# Patient Record
Sex: Male | Born: 1960 | Race: Asian | Hispanic: Refuse to answer | Marital: Married | Smoking: Former smoker
Health system: Southern US, Community
[De-identification: ages and names within clinical notes are randomized; demographics above are authoritative.]

## PROBLEM LIST (undated history)

## (undated) DIAGNOSIS — J45909 Unspecified asthma, uncomplicated: Secondary | ICD-10-CM

## (undated) DIAGNOSIS — A159 Respiratory tuberculosis unspecified: Secondary | ICD-10-CM

## (undated) HISTORY — PX: NO PAST SURGERIES: SHX2092

---

## 2017-02-09 ENCOUNTER — Emergency Department: Payer: Self-pay

## 2017-02-09 ENCOUNTER — Observation Stay
Admission: EM | Admit: 2017-02-09 | Discharge: 2017-02-10 | Disposition: A | Discharge status: Home / Self Care | Payer: Self-pay | Attending: Internal Medicine | Admitting: Internal Medicine

## 2017-02-09 DIAGNOSIS — J9601 Acute respiratory failure with hypoxia: Secondary | ICD-10-CM | POA: Insufficient documentation

## 2017-02-09 DIAGNOSIS — M25512 Pain in left shoulder: Secondary | ICD-10-CM | POA: Insufficient documentation

## 2017-02-09 DIAGNOSIS — Z8611 Personal history of tuberculosis: Secondary | ICD-10-CM | POA: Insufficient documentation

## 2017-02-09 DIAGNOSIS — M25519 Pain in unspecified shoulder: Secondary | ICD-10-CM

## 2017-02-09 DIAGNOSIS — J45901 Unspecified asthma with (acute) exacerbation: Secondary | ICD-10-CM | POA: Insufficient documentation

## 2017-02-09 DIAGNOSIS — J101 Influenza due to other identified influenza virus with other respiratory manifestations: Principal | ICD-10-CM | POA: Insufficient documentation

## 2017-02-09 HISTORY — DX: Unspecified asthma, uncomplicated: J45.909

## 2017-02-09 HISTORY — DX: Respiratory tuberculosis unspecified: A15.9

## 2017-02-09 LAB — COMPREHENSIVE METABOLIC PANEL
ALT: 19 U/L (ref 17–63)
AST: 41 U/L (ref 15–41)
Albumin: 3.5 g/dL (ref 3.5–5.0)
Alkaline Phosphatase: 49 U/L (ref 38–126)
Anion gap: 8 (ref 5–15)
BILIRUBIN TOTAL: 0.4 mg/dL (ref 0.3–1.2)
BUN: 18 mg/dL (ref 6–20)
CHLORIDE: 104 mmol/L (ref 101–111)
CO2: 25 mmol/L (ref 22–32)
Calcium: 7.9 mg/dL — ABNORMAL LOW (ref 8.9–10.3)
Creatinine, Ser: 0.99 mg/dL (ref 0.61–1.24)
Glucose, Bld: 144 mg/dL — ABNORMAL HIGH (ref 65–99)
POTASSIUM: 3.7 mmol/L (ref 3.5–5.1)
Sodium: 137 mmol/L (ref 135–145)
TOTAL PROTEIN: 7 g/dL (ref 6.5–8.1)

## 2017-02-09 LAB — CBC WITH DIFFERENTIAL/PLATELET
BASOS ABS: 0 10*3/uL (ref 0–0.1)
Basophils Relative: 0 %
Eosinophils Absolute: 0 10*3/uL (ref 0–0.7)
Eosinophils Relative: 0 %
HCT: 37.4 % — ABNORMAL LOW (ref 40.0–52.0)
Hemoglobin: 12.5 g/dL — ABNORMAL LOW (ref 13.0–18.0)
LYMPHS ABS: 0.8 10*3/uL — AB (ref 1.0–3.6)
LYMPHS PCT: 12 %
MCH: 30.7 pg (ref 26.0–34.0)
MCHC: 33.5 g/dL (ref 32.0–36.0)
MCV: 91.5 fL (ref 80.0–100.0)
Monocytes Absolute: 0.5 10*3/uL (ref 0.2–1.0)
Monocytes Relative: 8 %
NEUTROS ABS: 5.4 10*3/uL (ref 1.4–6.5)
Neutrophils Relative %: 80 %
PLATELETS: 126 10*3/uL — AB (ref 150–440)
RBC: 4.08 MIL/uL — AB (ref 4.40–5.90)
RDW: 13.4 % (ref 11.5–14.5)
WBC: 6.7 10*3/uL (ref 3.8–10.6)

## 2017-02-09 LAB — INFLUENZA PANEL BY PCR (TYPE A & B)
INFLBPCR: NEGATIVE
Influenza A By PCR: POSITIVE — AB

## 2017-02-09 LAB — TROPONIN I: Troponin I: <0.03 ng/mL (ref <0.03)

## 2017-02-09 MED ORDER — IPRATROPIUM-ALBUTEROL 0.5-2.5 (3) MG/3ML IN SOLN
3.0000 mL | Freq: Once | RESPIRATORY_TRACT | Status: AC
  Administered 2017-02-09: 3 mL via RESPIRATORY_TRACT
  Filled 2017-02-09: qty 3

## 2017-02-09 MED ORDER — OSELTAMIVIR PHOSPHATE 75 MG PO CAPS
75.0000 mg | ORAL_CAPSULE | Freq: Once | ORAL | Status: AC
  Administered 2017-02-09: 75 mg via ORAL
  Filled 2017-02-09: qty 1

## 2017-02-09 MED ORDER — ONDANSETRON HCL 4 MG/2ML IJ SOLN
4.0000 mg | Freq: Once | INTRAMUSCULAR | Status: AC
  Administered 2017-02-09: 4 mg via INTRAVENOUS
  Filled 2017-02-09: qty 2

## 2017-02-09 MED ORDER — SODIUM CHLORIDE 0.9 % IV BOLUS (SEPSIS)
1000.0000 mL | Freq: Once | INTRAVENOUS | Status: AC
  Administered 2017-02-10: 1000 mL via INTRAVENOUS

## 2017-02-09 MED ORDER — METHYLPREDNISOLONE SODIUM SUCC 125 MG IJ SOLR
125.0000 mg | Freq: Once | INTRAMUSCULAR | Status: AC
  Administered 2017-02-09: 125 mg via INTRAVENOUS
  Filled 2017-02-09: qty 2

## 2017-02-09 MED ORDER — IOPAMIDOL (ISOVUE-300) INJECTION 61%
75.0000 mL | Freq: Once | INTRAVENOUS | Status: AC | PRN
  Administered 2017-02-09: 75 mL via INTRAVENOUS

## 2017-02-09 MED ORDER — SODIUM CHLORIDE 0.9 % IV BOLUS (SEPSIS)
1000.0000 mL | Freq: Once | INTRAVENOUS | Status: AC
  Administered 2017-02-09: 1000 mL via INTRAVENOUS

## 2017-02-09 NOTE — ED Provider Notes (Addendum)
[facility] Emergency Department Provider Note  ____________________________________________  Time seen: Approximately 10:57 PM  I have reviewed the triage vital signs and the nursing notes.   HISTORY  Chief Complaint Emesis and Weakness   HPI Bradley Morrow is a 57 y.o. male with a history of asthma and tuberculosis for which patient was treated several years ago who presents for flulike symptoms since yesterday. Patient reports 2 episodes of nonbloody nonbilious emesis, cough productive of white phlegm, fever, chills, and shortness of breath. He has been wheezing. He complains of severe constant shortness of breath with it is worse with minimal exertion since yesterday. No diarrhea. Patient endorse dizziness.  Past Medical History:  Diagnosis Date  . Asthma   . Tuberculosis     There are no active problems to display for this patient.   History reviewed. No pertinent surgical history.  Prior to Admission medications   Not on File    Allergies Patient has no known allergies.  No family history on file.  Social History Social History   Tobacco Use  . Smoking status: Never Smoker  . Smokeless tobacco: Never Used  Substance Use Topics  . Alcohol use: No    Frequency: Never  . Drug use: Not on file    Review of Systems  Constitutional: + fever, chills, dizziness Eyes: Negative for visual changes. ENT: Negative for sore throat. Neck: No neck pain  Cardiovascular: Negative for chest pain. Respiratory: + shortness of breath, cough and wheezing Gastrointestinal: Negative for abdominal pain, diarrhea. + Nausea and vomiting Genitourinary: Negative for dysuria. Musculoskeletal: Negative for back pain. Skin: Negative for rash. Neurological: Negative for headaches, weakness or numbness. Psych: No SI or HI  ____________________________________________   PHYSICAL EXAM:  VITAL SIGNS: ED Triage Vitals  Enc Vitals Group     BP 02/09/17 2025  118/67     Pulse Rate 02/09/17 2025 (!) 53     Resp 02/09/17 2025 16     Temp 02/09/17 2025 98.6 F (37 C)     Temp Source 02/09/17 2025 Oral     SpO2 02/09/17 2022 94 %     Weight 02/09/17 2026 122 lb (55.3 kg)     Height --      Head Circumference --      Peak Flow --      Pain Score 02/09/17 2026 4     Pain Loc --      Pain Edu? --      Excl. in GC? --     Constitutional: Alert and oriented. Well appearing and in no apparent distress. HEENT:      Head: Normocephalic and atraumatic.         Eyes: Conjunctivae are normal. Sclera is non-icteric.       Mouth/Throat: Mucous membranes are moist.       Neck: Supple with no signs of meningismus. Cardiovascular: Regular rate and rhythm. No murmurs, gallops, or rubs. 2+ symmetrical distal pulses are present in all extremities. No JVD. Respiratory: Increased work of breathing, hypoxic on room air, diffuse expiratory wheezes and decreased air movement on the left Gastrointestinal: Soft, non tender, and non distended with positive bowel sounds. No rebound or guarding. Musculoskeletal: Nontender with normal range of motion in all extremities. No edema, cyanosis, or erythema of extremities. Neurologic: Normal speech and language. Face is symmetric. Moving all extremities. No gross focal neurologic deficits are appreciated. Skin: Skin is warm, dry and intact. No rash noted. Psychiatric: Mood and affect  are normal. Speech and behavior are normal.  ____________________________________________   LABS (all labs ordered are listed, but only abnormal results are displayed)  Labs Reviewed  COMPREHENSIVE METABOLIC PANEL - Abnormal; Notable for the following components:      Result Value   Glucose, Bld 144 (*)    Calcium 7.9 (*)    All other components within normal limits  CBC WITH DIFFERENTIAL/PLATELET - Abnormal; Notable for the following components:   RBC 4.08 (*)    Hemoglobin 12.5 (*)    HCT 37.4 (*)    Platelets 126 (*)    Lymphs Abs  0.8 (*)    All other components within normal limits  INFLUENZA PANEL BY PCR (TYPE A & B) - Abnormal; Notable for the following components:   Influenza A By PCR POSITIVE (*)    All other components within normal limits  URINALYSIS, COMPLETE (UACMP) WITH MICROSCOPIC  TROPONIN I  TROPONIN I   ____________________________________________  EKG  ED ECG REPORT I, Nita Sickle, the attending physician, personally viewed and interpreted this ECG.  Sinus bradycardia, rate of 56, normal intervals, normal axis, no ST elevations or depressions, diffuse T-wave flattening on inferior leads. No prior for comparison ____________________________________________  RADIOLOGY  Interpreted by me: CXR: Left hemithorax is completely obscured   Interpretation by Radiologist:  Dg Chest 2 View  Result Date: 02/09/2017 CLINICAL DATA:  Productive cough with white sputum. History of asthma and TB. EXAM: CHEST  2 VIEW COMPARISON:  None. FINDINGS: There is volume loss in the left hemithorax with shift of the heart mediastinum to the left. There is pleural thickening in the apex. There appear to be cystic changes in the left lung as well. No pneumothorax. The right lung is clear. IMPRESSION: 1. Volume loss in the left hemithorax with pleural thickening, cystic lung change, and diffuse mild haziness. While indeterminate, these findings could be chronic from the patient's history of previous TB. An acute on chronic process would be difficult to exclude. 2. No other abnormalities. Electronically Signed   By: Gerome Sam III M.D   On: 02/09/2017 20:44    ____________________________________________   PROCEDURES  Procedure(s) performed:None Procedures Critical Care performed: yes  CRITICAL CARE Performed by: Nita Sickle  ?  Total critical care time: 40 min  Critical care time was exclusive of separately billable procedures and treating other patients.  Critical care was necessary to treat or  prevent imminent or life-threatening deterioration.  Critical care was time spent personally by me on the following activities: development of treatment plan with patient and/or surrogate as well as nursing, discussions with consultants, evaluation of patient's response to treatment, examination of patient, obtaining history from patient or surrogate, ordering and performing treatments and interventions, ordering and review of laboratory studies, ordering and review of radiographic studies, pulse oximetry and re-evaluation of patient's condition.  ____________________________________________   INITIAL IMPRESSION / ASSESSMENT AND PLAN / ED COURSE   57 y.o. male with a history of asthma and tuberculosis for which patient was treated several years ago who presents for flulike symptoms since yesterday. Patient with an asthma exacerbation and influenza A positive. His chest x-ray is very abnormal. We'll have a prior for comparison. CT of his chest is pending to rule out superimposed pneumonia. Patient was able to be weaned off of oxygen after DuoNeb labs and feels slightly better however based on the results of his chest x-ray and the fact that his sats dropped to 89% with ambulation patient will be admitted to  the hospital.      As part of my medical decision making, I reviewed the following data within the electronic MEDICAL RECORD NUMBER History obtained from family, Nursing notes reviewed and incorporated, Labs reviewed , EKG interpreted , Radiograph reviewed , Discussed with admitting physician , Notes from prior ED visits and [place] Controlled Substance Database    Pertinent labs & imaging results that were available during my care of the patient were reviewed by me and considered in my medical decision making (see chart for details).    ____________________________________________   FINAL CLINICAL IMPRESSION(S) / ED DIAGNOSES  Final diagnoses:  Acute respiratory failure with hypoxia (HCC)    Influenza A  Exacerbation of asthma, unspecified asthma severity, unspecified whether persistent      NEW MEDICATIONS STARTED DURING THIS VISIT:  ED Discharge Orders    None       Note:  This document was prepared using Dragon voice recognition software and may include unintentional dictation errors.    Don PerkingVeronese, [place], MD 02/09/17 2313    Nita SickleVeronese, [place], MD 02/09/17 [phone]

## 2017-02-09 NOTE — ED Triage Notes (Signed)
Patient reports productive cough with white sputum X 2 days.   Patient reports hx of asthma and TB. Patient took home albuterol treatment.

## 2017-02-09 NOTE — ED Triage Notes (Signed)
Per EMS: patient c/o N/V and weakness X 2 days. Patient given 500 mL NaCL and 4 mg Zofran in route.   Patient reports 4 out of 10 headache.

## 2017-02-10 ENCOUNTER — Encounter: Payer: Self-pay | Admitting: Internal Medicine

## 2017-02-10 ENCOUNTER — Other Ambulatory Visit: Payer: Self-pay

## 2017-02-10 ENCOUNTER — Inpatient Hospital Stay: Payer: Self-pay

## 2017-02-10 DIAGNOSIS — Z8611 Personal history of tuberculosis: Secondary | ICD-10-CM | POA: Diagnosis present

## 2017-02-10 DIAGNOSIS — J101 Influenza due to other identified influenza virus with other respiratory manifestations: Secondary | ICD-10-CM | POA: Diagnosis present

## 2017-02-10 DIAGNOSIS — J45901 Unspecified asthma with (acute) exacerbation: Secondary | ICD-10-CM | POA: Diagnosis present

## 2017-02-10 LAB — CBC
HCT: 37.7 % — ABNORMAL LOW (ref 40.0–52.0)
Hemoglobin: 12.6 g/dL — ABNORMAL LOW (ref 13.0–18.0)
MCH: 30.9 pg (ref 26.0–34.0)
MCHC: 33.5 g/dL (ref 32.0–36.0)
MCV: 92.1 fL (ref 80.0–100.0)
PLATELETS: 125 10*3/uL — AB (ref 150–440)
RBC: 4.09 MIL/uL — ABNORMAL LOW (ref 4.40–5.90)
RDW: 13.4 % (ref 11.5–14.5)
WBC: 5.5 10*3/uL (ref 3.8–10.6)

## 2017-02-10 LAB — BASIC METABOLIC PANEL
ANION GAP: 8 (ref 5–15)
BUN: 14 mg/dL (ref 6–20)
CO2: 24 mmol/L (ref 22–32)
CREATININE: 0.96 mg/dL (ref 0.61–1.24)
Calcium: 7.9 mg/dL — ABNORMAL LOW (ref 8.9–10.3)
Chloride: 111 mmol/L (ref 101–111)
GFR calc Af Amer: >60 mL/min (ref >60)
GLUCOSE: 319 mg/dL — AB (ref 65–99)
Potassium: 4 mmol/L (ref 3.5–5.1)
Sodium: 143 mmol/L (ref 135–145)

## 2017-02-10 LAB — URINALYSIS, COMPLETE (UACMP) WITH MICROSCOPIC
Bacteria, UA: NONE SEEN
Bilirubin Urine: NEGATIVE
GLUCOSE, UA: 50 mg/dL — AB
Ketones, ur: 5 mg/dL — AB
Leukocytes, UA: NEGATIVE
Nitrite: NEGATIVE
PROTEIN: NEGATIVE mg/dL
SQUAMOUS EPITHELIAL / LPF: NONE SEEN
Specific Gravity, Urine: 1.013 (ref 1.005–1.030)
pH: 6 (ref 5.0–8.0)

## 2017-02-10 MED ORDER — PREDNISONE 10 MG (21) PO TBPK: ORAL_TABLET | ORAL | 0 refills | Status: DC

## 2017-02-10 MED ORDER — IPRATROPIUM-ALBUTEROL 0.5-2.5 (3) MG/3ML IN SOLN: 3.0000 mL | RESPIRATORY_TRACT | Status: DC | PRN

## 2017-02-10 MED ORDER — ENOXAPARIN SODIUM 40 MG/0.4ML ~~LOC~~ SOLN: 40.0000 mg | SUBCUTANEOUS | Status: DC

## 2017-02-10 MED ORDER — ONDANSETRON HCL 4 MG/2ML IJ SOLN: 4.0000 mg | Freq: Four times a day (QID) | INTRAMUSCULAR | Status: DC | PRN

## 2017-02-10 MED ORDER — METHYLPREDNISOLONE SODIUM SUCC 125 MG IJ SOLR: 60.0000 mg | Freq: Four times a day (QID) | INTRAMUSCULAR | Status: DC

## 2017-02-10 MED ORDER — ACETAMINOPHEN 650 MG RE SUPP: 650.0000 mg | Freq: Four times a day (QID) | RECTAL | Status: DC | PRN

## 2017-02-10 MED ORDER — ACETAMINOPHEN 325 MG PO TABS: 650.0000 mg | ORAL_TABLET | Freq: Four times a day (QID) | ORAL | Status: DC | PRN

## 2017-02-10 MED ORDER — TRIAMCINOLONE ACETONIDE 40 MG/ML IJ SUSP
40.0000 mg | Freq: Once | INTRAMUSCULAR | Status: DC
  Filled 2017-02-10: qty 1

## 2017-02-10 MED ORDER — ONDANSETRON HCL 4 MG PO TABS: 4.0000 mg | ORAL_TABLET | Freq: Four times a day (QID) | ORAL | Status: DC | PRN

## 2017-02-10 MED ORDER — OSELTAMIVIR PHOSPHATE 75 MG PO CAPS: 75.0000 mg | ORAL_CAPSULE | Freq: Two times a day (BID) | ORAL | Status: DC

## 2017-02-10 MED ORDER — GUAIFENESIN-DM 100-10 MG/5ML PO SYRP: 5.0000 mL | ORAL_SOLUTION | ORAL | Status: DC | PRN

## 2017-02-10 MED ORDER — ENSURE ENLIVE PO LIQD: 237.0000 mL | Freq: Two times a day (BID) | ORAL | Status: DC

## 2017-02-10 MED ORDER — ALBUTEROL SULFATE HFA 108 (90 BASE) MCG/ACT IN AERS: 2.0000 | INHALATION_SPRAY | Freq: Four times a day (QID) | RESPIRATORY_TRACT | 2 refills | Status: DC | PRN

## 2017-02-10 MED ORDER — BUPIVACAINE HCL (PF) 0.5 % IJ SOLN
4.0000 mL | Freq: Once | INTRAMUSCULAR | Status: DC
  Filled 2017-02-10: qty 10

## 2017-02-10 MED ORDER — OSELTAMIVIR PHOSPHATE 75 MG PO CAPS: 75.0000 mg | ORAL_CAPSULE | Freq: Two times a day (BID) | ORAL | 0 refills | Status: DC

## 2017-02-10 MED ORDER — ENSURE ENLIVE PO LIQD: 237.0000 mL | Freq: Two times a day (BID) | ORAL | 12 refills | Status: AC

## 2017-02-10 NOTE — Progress Notes (Signed)
Initial Nutrition Assessment  DOCUMENTATION CODES:   Not applicable  INTERVENTION:  1. Ensure Enlive po BID, each supplement provides 350 kcal and 20 grams of protein  NUTRITION DIAGNOSIS:   Inadequate oral intake related to poor appetite, acute illness, nausea, vomiting as evidenced by per patient/family report  GOAL:   Patient will meet greater than or equal to 90% of their needs  MONITOR:   PO intake, I & O's, Labs, Weight trends, Supplement acceptance  REASON FOR ASSESSMENT:   Malnutrition Screening Tool    ASSESSMENT:   Gunnar FusiHrang Dorvil is a 57 yo male with PMH TB, presents with Influenza A diagnosis, Asthma Exacerbation  Spoke with patient via daughter as interpreter at bedside. Patient hasn't eaten much over the past 2 months. Sometimes eats well, sometimes doesn't but states he eats poorly more often than not, often just eating bites of 2 meals per day. UBW of 121 pounds, down to 118 now, insignificant for timeframe. Had 1/2 a bacon biscuit from [employer], juice, and some yogurt this morning.  [facility] is also [facility].  Labs reviewed Medications reviewed and include: Solumedrol   NUTRITION - FOCUSED PHYSICAL EXAM:    Most Recent Value  Orbital Region  No depletion  Upper Arm Region  No depletion  Thoracic and Lumbar Region  No depletion  Buccal Region  No depletion  Temple Region  Mild depletion  Clavicle Bone Region  No depletion  Clavicle and Acromion Bone Region  No depletion  Scapular Bone Region  Mild depletion  Dorsal Hand  No depletion  Patellar Region  Mild depletion  Anterior Thigh Region  Mild depletion  Posterior Calf Region  Mild depletion  Edema (RD Assessment)  None  Hair  Reviewed  Eyes  Reviewed  Mouth  Reviewed  Skin  Reviewed  Nails  Reviewed       Diet Order:  Diet regular Room service appropriate? Yes; Fluid consistency: Thin  EDUCATION NEEDS:   Education needs have been addressed  Skin:  Skin Assessment:  Reviewed RN Assessment  Last BM:  02/09/2017  Height:   Ht Readings from Last 1 Encounters:  02/10/17 5\' 8"  (1.727 m)    Weight:   Wt Readings from Last 1 Encounters:  02/10/17 118 lb 6.4 oz (53.7 kg)    Ideal Body Weight:  70 kg  BMI:  Body mass index is 18 kg/m.  Estimated Nutritional Needs:   Kcal:  1750-1900 calories (MSJ x1.3-1.4)  Protein:  75-86 grams (1.4-1.6g/kg)  Fluid:  1.75-1.9L   Dionne AnoWilliam M. Kehlani Vancamp, MS, RD LDN Inpatient Clinical Dietitian Pager [phone]

## 2017-02-10 NOTE — H&P (Signed)
[facility] [place] [facility]   PATIENT NAME: Bradley Morrow    MR#:  [id number]  DATE OF BIRTH:  02/01/1960  DATE OF ADMISSION:  02/09/2017  PRIMARY CARE PHYSICIAN: Services, [facility]   REQUESTING/REFERRING PHYSICIAN: Don Perking, MD  CHIEF COMPLAINT:   Chief Complaint  Patient presents with  . Emesis  . Weakness    HISTORY OF PRESENT ILLNESS:  Bradley Morrow  is a 57 y.o. male who presents with fever and chills, myalgias for the past 24 hours.  Patient was found to be influenza A positive here in the ED.  He also had a significantly abnormal chest x-ray which prompted CT scan which showed left upper lobe cystic lesions and fibrotic changes in his lung concerning for potential TB sequela or active TB given the patient's history of TB.  Patient and family state that he was diagnosed with TB in 2009 and treated for 18 months, but has not had recently immigrated patient is not very clear about many details of that treatment other than that he took the medications.  He does not know if he had the sequela detected in any earlier point in time.  He states that since that time he has had some pain in his left chest.  He also developed some reactive airway symptoms to weather changes.  He states that he is unsure if he has been having any significant night sweats, but he has had a 5 pound intentional weight loss over the last month and a half.  He also complains of some left shoulder pain, anterior in location, which he states started about a month and a half ago.  He does not recall any injury to that shoulder.  Given all the above, hospitalist were called for admission  PAST MEDICAL HISTORY:   Past Medical History:  Diagnosis Date  . Asthma   . Tuberculosis     PAST SURGICAL HISTORY:   Past Surgical History:  Procedure Laterality Date  . NO PAST SURGERIES      SOCIAL HISTORY:   Social History   Tobacco Use  . Smoking status: Never Smoker  . Smokeless  tobacco: Never Used  Substance Use Topics  . Alcohol use: No    Frequency: Never    FAMILY HISTORY:   Family History  Family history unknown: Yes    DRUG ALLERGIES:  No Known Allergies  MEDICATIONS AT HOME:   Prior to Admission medications   Not on File    REVIEW OF SYSTEMS:  Review of Systems  Constitutional: Positive for chills, fever and malaise/fatigue. Negative for weight loss.  HENT: Negative for ear pain, hearing loss and tinnitus.   Eyes: Negative for blurred vision, double vision, pain and redness.  Respiratory: Positive for cough. Negative for hemoptysis and shortness of breath.   Cardiovascular: Negative for chest pain, palpitations, orthopnea and leg swelling.  Gastrointestinal: Negative for abdominal pain, constipation, diarrhea, nausea and vomiting.  Genitourinary: Negative for dysuria, frequency and hematuria.  Musculoskeletal: Positive for joint pain (Left shoulder) and myalgias. Negative for back pain and neck pain.  Skin:       No acne, rash, or lesions  Neurological: Negative for dizziness, tremors, focal weakness and weakness.  Endo/Heme/Allergies: Negative for polydipsia. Does not bruise/bleed easily.  Psychiatric/Behavioral: Negative for depression. The patient is not nervous/anxious and does not have insomnia.      VITAL SIGNS:   Vitals:   02/09/17 2130 02/09/17 2200 02/09/17 2230 02/10/17 0039  BP: 106/70  108/65 (!) 106/57 102/60  Pulse: 61 77  62  Resp: 14 17 18 18   Temp:      TempSrc:      SpO2: 92% 96%  93%  Weight:       Wt Readings from Last 3 Encounters:  02/09/17 55.3 kg (122 lb)    PHYSICAL EXAMINATION:  Physical Exam  Vitals reviewed. Constitutional: He is oriented to person, place, and time. He appears well-developed and well-nourished. No distress.  HENT:  Head: Normocephalic and atraumatic.  Mouth/Throat: Oropharynx is clear and moist.  Eyes: Conjunctivae and EOM are normal. Pupils are equal, round, and reactive to  light. No scleral icterus.  Neck: Normal range of motion. Neck supple. No JVD present. No thyromegaly present.  Cardiovascular: Normal rate, regular rhythm and intact distal pulses. Exam reveals no gallop and no friction rub.  No murmur heard. Respiratory: Effort normal. No respiratory distress. He has no wheezes. He has rales (Left lung).  GI: Soft. Bowel sounds are normal. He exhibits no distension. There is no tenderness.  Musculoskeletal: Normal range of motion. He exhibits tenderness (Interior left shoulder). He exhibits no edema.  No arthritis, no gout  Lymphadenopathy:    He has no cervical adenopathy.  Neurological: He is alert and oriented to person, place, and time. No cranial nerve deficit.  No dysarthria, no aphasia  Skin: Skin is warm and dry. No rash noted. No erythema.  Psychiatric: He has a normal mood and affect. His behavior is normal. Judgment and thought content normal.    LABORATORY PANEL:   CBC Recent Labs  Lab 02/09/17 2053  WBC 6.7  HGB 12.5*  HCT 37.4*  PLT 126*   ------------------------------------------------------------------------------------------------------------------  Chemistries  Recent Labs  Lab 02/09/17 2053  NA 137  K 3.7  CL 104  CO2 25  GLUCOSE 144*  BUN 18  CREATININE 0.99  CALCIUM 7.9*  AST 41  ALT 19  ALKPHOS 49  BILITOT 0.4   ------------------------------------------------------------------------------------------------------------------  Cardiac Enzymes Recent Labs  Lab 02/09/17 2053  TROPONINI <0.03   ------------------------------------------------------------------------------------------------------------------  RADIOLOGY:  Dg Chest 2 View  Result Date: 02/09/2017 CLINICAL DATA:  Productive cough with white sputum. History of asthma and TB. EXAM: CHEST  2 VIEW COMPARISON:  None. FINDINGS: There is volume loss in the left hemithorax with shift of the heart mediastinum to the left. There is pleural thickening  in the apex. There appear to be cystic changes in the left lung as well. No pneumothorax. The right lung is clear. IMPRESSION: 1. Volume loss in the left hemithorax with pleural thickening, cystic lung change, and diffuse mild haziness. While indeterminate, these findings could be chronic from the patient's history of previous TB. An acute on chronic process would be difficult to exclude. 2. No other abnormalities. Electronically Signed   By: Gerome Sam III M.D   On: 02/09/2017 20:44   Ct Chest W Contrast  Result Date: 02/09/2017 CLINICAL DATA:  Acute onset of productive cough. Shortness of breath. EXAM: CT CHEST WITH CONTRAST TECHNIQUE: Multidetector CT imaging of the chest was performed during intravenous contrast administration. CONTRAST:  75mL ISOVUE-300 IOPAMIDOL (ISOVUE-300) INJECTION 61% COMPARISON:  Chest radiograph performed earlier today at 8:33 p.m. FINDINGS: Cardiovascular: The heart is normal in size. The thoracic aorta is unremarkable in appearance. The great vessels are within normal limits. Mediastinum/Nodes: There is leftward shift of the mediastinum, reflecting left-sided volume loss. A mildly prominent 1.1 cm subcarinal node is noted. No pericardial effusion is identified. The thyroid gland  is unremarkable. No axillary lymphadenopathy is seen. Lungs/Pleura: Numerous cysts are noted occupying the left lung, with underlying diffuse peribronchial thickening. There is dense scarring involving much of the left lung apex. This likely reflects sequelae of the patient's tuberculosis. Acute tuberculosis is a concern, given the patient's symptoms. Minimal nodular peribronchovascular opacities are noted at the right upper and middle lung lobes, concerning for infection. The right lung is otherwise clear. No pleural effusion or pneumothorax is seen. Upper Abdomen: The visualized portions of the liver and spleen are unremarkable. The pancreatic duct is borderline prominent. The visualized portions of  the adrenal glands and kidneys are within normal limits. Musculoskeletal: No acute osseous abnormalities are identified. The visualized musculature is unremarkable in appearance. IMPRESSION: 1. Numerous cysts occupying the left lung, with underlying diffuse peribronchial thickening. Dense scarring involving much of the left lung apex. This likely reflects sequelae of the patient's tuberculosis. Given the patient's symptoms, a active tuberculosis is a concern. 2. Minimal nodular peribronchovascular opacities at the right upper and middle lung lobes, also concerning for infection. 3. Mildly prominent 1.1 cm subcarinal node. These results were called by telephone at the time of interpretation on 02/09/2017 at 11:49 pm to Dr. Nita SickleAROLINA VERONESE, who verbally acknowledged these results. Electronically Signed   By: Roanna RaiderJeffery  Chang M.D.   On: 02/09/2017 23:50    EKG:   Orders placed or performed during the hospital encounter of 02/09/17  . EKG 12-Lead  . EKG 12-Lead  . ED EKG  . ED EKG    IMPRESSION AND PLAN:  Principal Problem:   Influenza A -Tamiflu started, droplet precautions, supportive treatment Active Problems:   Asthma exacerbation -IV steroids, asthma exacerbated by influenza infection   Left shoulder pain -unclear etiology, we will get plain films right now and and orthopedics consult to assist in further evaluation   History of tuberculosis -patient states he received treatment for an extended period of time, it is possible this could all be scarring and sequela from TB.  However without more documented information from his treatment at the time we will keep him on airborne isolation for now, check AFB sputum cultures, and get an ID consult for further assistance  All the records are reviewed and case discussed with ED provider. Management plans discussed with the patient and/or family.  DVT PROPHYLAXIS: SubQ lovenox  GI PROPHYLAXIS: None  ADMISSION STATUS: Inpatient  CODE STATUS:  Full Code Status History    This patient does not have a recorded code status. Please follow your organizational policy for patients in this situation.      TOTAL TIME TAKING CARE OF THIS PATIENT: 45 minutes.   Tari Lecount FIELDING 02/10/2017, 1:15 AM  [facility]  Office  [phone]  CC: Primary care physician; [facility]  Note:  This document was prepared using Conservation officer, historic buildingsDragon voice recognition software and may include unintentional dictation errors.

## 2017-02-10 NOTE — Consult Note (Signed)
[facility] Infectious Disease     Reason for Consult: Flu, TB suspect   Referring Physician: Governor Specking Date of Admission:  02/09/2017   Principal Problem:   Influenza A Active Problems:   Asthma exacerbation   History of tuberculosis   HPI: Bradley Morrow is a 57 y.o. male burmese gentleman who has been ill for 4-5 days with fevers, body aches, cough and HA.  He has tested + for influenza. His daughter had the flu last week. Prior to this he has been well with no fevers, chills, cough, nigh sweats or weight loss. He had hx TB treated starting in [place] as a refugee and then in [place]. When he moved to [facility] he was still symptomatic so had another 18 months of treatment. He has fully recovered since this was in 2009. He has had neg smears at the [facility] in 2010 and 2013  Past Medical History:  Diagnosis Date  . Asthma   . Tuberculosis    Past Surgical History:  Procedure Laterality Date  . NO PAST SURGERIES     Social History   Tobacco Use  . Smoking status: Never Smoker  . Smokeless tobacco: Never Used  Substance Use Topics  . Alcohol use: No    Frequency: Never  . Drug use: No   Family History  Family history unknown: Yes    Allergies: No Known Allergies  Current antibiotics: Antibiotics Given (last 72 hours)    Date/Time Action Medication Dose   02/09/17 2320 Given   oseltamivir (TAMIFLU) capsule 75 mg 75 mg      MEDICATIONS: . enoxaparin (LOVENOX) injection  40 mg Subcutaneous Q24H  . feeding supplement (ENSURE ENLIVE)  237 mL Oral BID BM  . [START ON 02/11/2017] methylPREDNISolone (SOLU-MEDROL) injection  60 mg Intravenous Q6H  . [START ON 02/11/2017] oseltamivir  75 mg Oral BID    Review of Systems - 11 systems reviewed and negative per HPI   OBJECTIVE: Temp:  [97.9 F (36.6 C)-98.6 F (37 C)] 97.9 F (36.6 C) (02/08 1305) Pulse Rate:  [50-77] 59 (02/08 1305) Resp:  [14-20] 16 (02/08 1305) BP: (90-118)/(45-70) 108/60 (02/08  1305) SpO2:  [92 %-97 %] 96 % (02/08 1305) Weight:  [53.7 kg (118 lb 6.4 oz)-55.3 kg (122 lb)] 53.7 kg (118 lb 6.4 oz) (02/08 0218) Physical Exam  Constitutional: He is oriented to person, place, and time. He appears well-developed and well-nourished. No distress.  HENT:  Mouth/Throat: Oropharynx is clear and moist. No oropharyngeal exudate.  Cardiovascular: Normal rate, regular rhythm and normal heart sounds. Exam reveals no gallop and no friction rub.  No murmur heard.  Pulmonary/Chest: Effort normal and breath sounds normal. No respiratory distress. He has no wheezes.  Abdominal: Soft. Bowel sounds are normal. He exhibits no distension. There is no tenderness.  Lymphadenopathy:  He has no cervical adenopathy.  Neurological: He is alert and oriented to person, place, and time.  Skin: Skin is warm and dry. No rash noted. No erythema.  Psychiatric: He has a normal mood and affect. His behavior is normal.     LABS: Results for orders placed or performed during the hospital encounter of 02/09/17 (from the past 48 hour(s))  Comprehensive metabolic panel     Status: Abnormal   Collection Time: 02/09/17  8:53 PM  Result Value Ref Range   Sodium 137 135 - 145 mmol/L   Potassium 3.7 3.5 - 5.1 mmol/L   Chloride 104 101 - 111 mmol/L  CO2 25 22 - 32 mmol/L   Glucose, Bld 144 (H) 65 - 99 mg/dL   BUN 18 6 - 20 mg/dL   Creatinine, Ser 0.99 0.61 - 1.24 mg/dL   Calcium 7.9 (L) 8.9 - 10.3 mg/dL   Total Protein 7.0 6.5 - 8.1 g/dL   Albumin 3.5 3.5 - 5.0 g/dL   AST 41 15 - 41 U/L   ALT 19 17 - 63 U/L   Alkaline Phosphatase 49 38 - 126 U/L   Total Bilirubin 0.4 0.3 - 1.2 mg/dL   GFR calc non Af Amer >60 >60 mL/min   GFR calc Af Amer >60 >60 mL/min    Comment: (NOTE) The eGFR has been calculated using the CKD EPI equation. This calculation has not been validated in all clinical situations. eGFR's persistently <60 mL/min signify possible Chronic Kidney Disease.    Anion gap 8 5 - 15     Comment: Performed at [facility], [address] [place], [place] [address]  CBC with Differential     Status: Abnormal   Collection Time: 02/09/17  8:53 PM  Result Value Ref Range   WBC 6.7 3.8 - 10.6 K/uL   RBC 4.08 (L) 4.40 - 5.90 MIL/uL   Hemoglobin 12.5 (L) 13.0 - 18.0 g/dL   HCT 37.4 (L) 40.0 - 52.0 %   MCV 91.5 80.0 - 100.0 fL   MCH 30.7 26.0 - 34.0 pg   MCHC 33.5 32.0 - 36.0 g/dL   RDW 13.4 11.5 - 14.5 %   Platelets 126 (L) 150 - 440 K/uL   Neutrophils Relative % 80 %   Neutro Abs 5.4 1.4 - 6.5 K/uL   Lymphocytes Relative 12 %   Lymphs Abs 0.8 (L) 1.0 - 3.6 K/uL   Monocytes Relative 8 %   Monocytes Absolute 0.5 0.2 - 1.0 K/uL   Eosinophils Relative 0 %   Eosinophils Absolute 0.0 0 - 0.7 K/uL   Basophils Relative 0 %   Basophils Absolute 0.0 0 - 0.1 K/uL    Comment: Performed at [facility], [address] [place], [place] [address]  Troponin I     Status: None   Collection Time: 02/09/17  8:53 PM  Result Value Ref Range   Troponin I <0.03 <0.03 ng/mL    Comment: Performed at [facility], [address] [place], [place] [address]  Influenza panel by PCR (type A & B)     Status: Abnormal   Collection Time: 02/09/17  9:51 PM  Result Value Ref Range   Influenza A By PCR POSITIVE (A) NEGATIVE   Influenza B By PCR NEGATIVE NEGATIVE    Comment: (NOTE) The Xpert Xpress Flu assay is intended as an aid in the diagnosis of  influenza and should not be used as a sole basis for treatment.  This  assay is FDA approved for nasopharyngeal swab specimens only. Nasal  washings and aspirates are unacceptable for Xpert Xpress Flu testing. Performed at [facility], [address] [place], [place] [address]   Urinalysis, Complete w Microscopic     Status: Abnormal   Collection Time: 02/10/17  3:03 AM  Result Value Ref Range   Color, Urine STRAW (A) YELLOW   APPearance CLEAR (A) CLEAR   Specific Gravity, Urine 1.013 1.005 - 1.030   pH  6.0 5.0 - 8.0   Glucose, UA 50 (A) NEGATIVE mg/dL   Hgb urine dipstick SMALL (A) NEGATIVE   Bilirubin Urine NEGATIVE NEGATIVE  Ketones, ur 5 (A) NEGATIVE mg/dL   Protein, ur NEGATIVE NEGATIVE mg/dL   Nitrite NEGATIVE NEGATIVE   Leukocytes, UA NEGATIVE NEGATIVE   RBC / HPF 0-5 0 - 5 RBC/hpf   WBC, UA 0-5 0 - 5 WBC/hpf   Bacteria, UA NONE SEEN NONE SEEN   Squamous Epithelial / LPF NONE SEEN NONE SEEN    Comment: Performed at [facility], [address], [place], [place] [address]  Basic metabolic panel     Status: Abnormal   Collection Time: 02/10/17  4:41 AM  Result Value Ref Range   Sodium 143 135 - 145 mmol/L   Potassium 4.0 3.5 - 5.1 mmol/L   Chloride 111 101 - 111 mmol/L   CO2 24 22 - 32 mmol/L   Glucose, Bld 319 (H) 65 - 99 mg/dL   BUN 14 6 - 20 mg/dL   Creatinine, Ser 0.96 0.61 - 1.24 mg/dL   Calcium 7.9 (L) 8.9 - 10.3 mg/dL   GFR calc non Af Amer >60 >60 mL/min   GFR calc Af Amer >60 >60 mL/min    Comment: (NOTE) The eGFR has been calculated using the CKD EPI equation. This calculation has not been validated in all clinical situations. eGFR's persistently <60 mL/min signify possible Chronic Kidney Disease.    Anion gap 8 5 - 15    Comment: Performed at [facility], [address] [place], [place] [address]  CBC     Status: Abnormal   Collection Time: 02/10/17  4:41 AM  Result Value Ref Range   WBC 5.5 3.8 - 10.6 K/uL   RBC 4.09 (L) 4.40 - 5.90 MIL/uL   Hemoglobin 12.6 (L) 13.0 - 18.0 g/dL   HCT 37.7 (L) 40.0 - 52.0 %   MCV 92.1 80.0 - 100.0 fL   MCH 30.9 26.0 - 34.0 pg   MCHC 33.5 32.0 - 36.0 g/dL   RDW 13.4 11.5 - 14.5 %   Platelets 125 (L) 150 - 440 K/uL    Comment: Performed at [facility], [address] [place], [place] [address]   No components found for: ESR, C REACTIVE PROTEIN MICRO: No results found for this or any previous visit (from the past 720 hour(s)).  IMAGING: Dg Chest 2 View  Result Date:  02/09/2017 CLINICAL DATA:  Productive cough with white sputum. History of asthma and TB. EXAM: CHEST  2 VIEW COMPARISON:  None. FINDINGS: There is volume loss in the left hemithorax with shift of the heart mediastinum to the left. There is pleural thickening in the apex. There appear to be cystic changes in the left lung as well. No pneumothorax. The right lung is clear. IMPRESSION: 1. Volume loss in the left hemithorax with pleural thickening, cystic lung change, and diffuse mild haziness. While indeterminate, these findings could be chronic from the patient's history of previous TB. An acute on chronic process would be difficult to exclude. 2. No other abnormalities. Electronically Signed   By: Dorise Bullion III M.D   On: 02/09/2017 20:44   Ct Chest W Contrast  Result Date: 02/09/2017 CLINICAL DATA:  Acute onset of productive cough. Shortness of breath. EXAM: CT CHEST WITH CONTRAST TECHNIQUE: Multidetector CT imaging of the chest was performed during intravenous contrast administration. CONTRAST:  59m ISOVUE-300 IOPAMIDOL (ISOVUE-300) INJECTION 61% COMPARISON:  Chest radiograph performed earlier today at 8:33 p.m. FINDINGS: Cardiovascular: The heart is normal in size. The thoracic aorta is unremarkable in appearance. The great vessels are within normal limits.  Mediastinum/Nodes: There is leftward shift of the mediastinum, reflecting left-sided volume loss. A mildly prominent 1.1 cm subcarinal node is noted. No pericardial effusion is identified. The thyroid gland is unremarkable. No axillary lymphadenopathy is seen. Lungs/Pleura: Numerous cysts are noted occupying the left lung, with underlying diffuse peribronchial thickening. There is dense scarring involving much of the left lung apex. This likely reflects sequelae of the patient's tuberculosis. Acute tuberculosis is a concern, given the patient's symptoms. Minimal nodular peribronchovascular opacities are noted at the right upper and middle lung lobes,  concerning for infection. The right lung is otherwise clear. No pleural effusion or pneumothorax is seen. Upper Abdomen: The visualized portions of the liver and spleen are unremarkable. The pancreatic duct is borderline prominent. The visualized portions of the adrenal glands and kidneys are within normal limits. Musculoskeletal: No acute osseous abnormalities are identified. The visualized musculature is unremarkable in appearance. IMPRESSION: 1. Numerous cysts occupying the left lung, with underlying diffuse peribronchial thickening. Dense scarring involving much of the left lung apex. This likely reflects sequelae of the patient's tuberculosis. Given the patient's symptoms, a active tuberculosis is a concern. 2. Minimal nodular peribronchovascular opacities at the right upper and middle lung lobes, also concerning for infection. 3. Mildly prominent 1.1 cm subcarinal node. These results were called by telephone at the time of interpretation on 02/09/2017 at 11:49 pm to Dr. Rudene Re, who verbally acknowledged these results. Electronically Signed   By: Garald Balding M.D.   On: 02/09/2017 23:50   Dg Shoulder Left  Result Date: 02/10/2017 CLINICAL DATA:  Left shoulder pain EXAM: LEFT SHOULDER - 2+ VIEW COMPARISON:  None. FINDINGS: There is no evidence of fracture or dislocation. There is no evidence of arthropathy or other focal bone abnormality. Soft tissues are unremarkable. Left apical pleural and parenchymal disease. IMPRESSION: No acute osseous abnormality Electronically Signed   By: Donavan Foil M.D.   On: 02/10/2017 03:20    Assessment:   Bradley Morrow is a 57 y.o. male with hx of previously extensively treated TB who now has the flu. He has chronic scarring in his lungs from prior TB. He has no symptoms of recurrent TB as his current cough and fevers are acute in onset for last 5 days, with his usual state of health prior. He had neg fu TB cultures in 2010 and 2013 after treatment in [facility].   Recommendations Can treat for Flu with tamiflu No need for TB isolation or rule out. I have notified the HD and they will call him next week.  Thank you very much for allowing me to participate in the care of this patient. Please call with questions.   Cheral Marker. Ola Spurr, MD

## 2017-02-10 NOTE — Progress Notes (Signed)
Inpatient Diabetes Program Recommendations  AACE/ADA: New Consensus Statement on Inpatient Glycemic Control (2015)  Target Ranges:  Prepandial:   less than 140 mg/dL      Peak postprandial:   less than 180 mg/dL (1-2 hours)      Critically ill patients:  140 - 180 mg/dL   No results found for: GLUCAP, HGBA1C  Review of Glycemic Control Results for Bradley Morrow, Bradley Morrow (MRN [id number]) as of 02/10/2017 13:26  Ref. Range 02/10/2017 04:41  Glucose Latest Ref Range: 65 - 99 mg/dL 621319 (H)   Diabetes history: No prior hx  Inpatient Diabetes Program Recommendations:   Noted blood glucose 319 this am. While on steroids, please consider: -Novolog correction scale tid + hs with Novolog sensitive correction  Thank you, Billy FischerJudy E. Tyrika Newman, RN, MSN, CDE  Diabetes Coordinator Inpatient Glycemic Control Team Team Pager [phone] (8am-5pm) 02/10/2017 1:28 PM

## 2017-02-10 NOTE — Progress Notes (Signed)
Chaplain responded to the consult and found the patient in isolation. After speaking with the daughter, it was agreed that the AD would be completed when the patient is not in isolation, as the notary must witness the patient signing the document.  Chaplain spoke with the On-Call Chaplain to explain the situation.  The patient will need specific translation of Burmese [place] dialect. The daughter will ask for a chaplain to return to complete the AD when the patient's signature can be witnessed.

## 2017-02-10 NOTE — Consult Note (Signed)
ORTHOPAEDIC CONSULTATION  REQUESTING PHYSICIAN: Katha Hamming, MD  Chief Complaint:   Left shoulder pain.  History of Present Illness: Bradley Morrow is a 57 y.o. male with a history of asthma and tuberculosis who was admitted last evening for the flu.  At this admission, he was complaining of left shoulder pain, so have been asked to evaluate this patient for any rotator cuff pathology.  The patient notes that he has had left shoulder symptoms for approximately 2 months.  He denies any antecedent injury contributing to the onset of the symptoms.  He does note some mild paresthesias intermittently running down his arm to his hand but localizes the majority of the pain to the anterior and anterolateral aspect of the shoulder.  His symptoms are aggravated by activities away from his body, as well as with overhead activities.  Repetitive activities and carrying heavier loads are even more aggravating.  He notes occasional mild neck soreness.  He denies any remote history of injury to either his neck or shoulder.  Past Medical History:  Diagnosis Date  . Asthma   . Tuberculosis    Past Surgical History:  Procedure Laterality Date  . NO PAST SURGERIES     Social History   Socioeconomic History  . Marital status: Married    Spouse name: None  . Number of children: None  . Years of education: None  . Highest education level: None  Social Needs  . Financial resource strain: None  . Food insecurity - worry: None  . Food insecurity - inability: None  . Transportation needs - medical: None  . Transportation needs - non-medical: None  Occupational History  . None  Tobacco Use  . Smoking status: Never Smoker  . Smokeless tobacco: Never Used  Substance and Sexual Activity  . Alcohol use: No    Frequency: Never  . Drug use: No  . Sexual activity: None  Other Topics Concern  . None  Social History Narrative  . None    Family History  Family history unknown: Yes   No Known Allergies Prior to Admission medications   Medication Sig Start Date End Date Taking? Authorizing Provider  albuterol (PROVENTIL HFA;VENTOLIN HFA) 108 (90 Base) MCG/ACT inhaler Inhale 2 puffs into the lungs every 6 (six) hours as needed for wheezing or shortness of breath. 02/10/17   Katha Hamming, MD  feeding supplement, ENSURE ENLIVE, (ENSURE ENLIVE) LIQD Take 237 mLs by mouth 2 (two) times daily between meals. 02/10/17   Katha Hamming, MD  oseltamivir (TAMIFLU) 75 MG capsule Take 1 capsule (75 mg total) by mouth 2 (two) times daily. 02/11/17   Katha Hamming, MD  predniSONE (STERAPRED UNI-PAK 21 TAB) 10 MG (21) TBPK tablet Taper by 10 mg daily 02/10/17   Katha Hamming, MD   Dg Chest 2 View  Result Date: 02/09/2017 CLINICAL DATA:  Productive cough with white sputum. History of asthma and TB. EXAM: CHEST  2 VIEW COMPARISON:  None. FINDINGS: There is volume loss in the left hemithorax with shift of the heart mediastinum to the left. There is pleural thickening in the apex. There appear to be cystic changes in the left lung as well. No pneumothorax. The right lung is clear. IMPRESSION: 1. Volume loss in the left hemithorax with pleural thickening, cystic lung change, and diffuse mild haziness. While indeterminate, these findings could be chronic from the patient's history of previous TB. An acute on chronic process would be difficult to exclude. 2. No other abnormalities. Electronically Signed  By: Gerome Sam III M.D   On: 02/09/2017 20:44   Ct Chest W Contrast  Result Date: 02/09/2017 CLINICAL DATA:  Acute onset of productive cough. Shortness of breath. EXAM: CT CHEST WITH CONTRAST TECHNIQUE: Multidetector CT imaging of the chest was performed during intravenous contrast administration. CONTRAST:  75mL ISOVUE-300 IOPAMIDOL (ISOVUE-300) INJECTION 61% COMPARISON:  Chest radiograph performed earlier today at 8:33 p.m.  FINDINGS: Cardiovascular: The heart is normal in size. The thoracic aorta is unremarkable in appearance. The great vessels are within normal limits. Mediastinum/Nodes: There is leftward shift of the mediastinum, reflecting left-sided volume loss. A mildly prominent 1.1 cm subcarinal node is noted. No pericardial effusion is identified. The thyroid gland is unremarkable. No axillary lymphadenopathy is seen. Lungs/Pleura: Numerous cysts are noted occupying the left lung, with underlying diffuse peribronchial thickening. There is dense scarring involving much of the left lung apex. This likely reflects sequelae of the patient's tuberculosis. Acute tuberculosis is a concern, given the patient's symptoms. Minimal nodular peribronchovascular opacities are noted at the right upper and middle lung lobes, concerning for infection. The right lung is otherwise clear. No pleural effusion or pneumothorax is seen. Upper Abdomen: The visualized portions of the liver and spleen are unremarkable. The pancreatic duct is borderline prominent. The visualized portions of the adrenal glands and kidneys are within normal limits. Musculoskeletal: No acute osseous abnormalities are identified. The visualized musculature is unremarkable in appearance. IMPRESSION: 1. Numerous cysts occupying the left lung, with underlying diffuse peribronchial thickening. Dense scarring involving much of the left lung apex. This likely reflects sequelae of the patient's tuberculosis. Given the patient's symptoms, a active tuberculosis is a concern. 2. Minimal nodular peribronchovascular opacities at the right upper and middle lung lobes, also concerning for infection. 3. Mildly prominent 1.1 cm subcarinal node. These results were called by telephone at the time of interpretation on 02/09/2017 at 11:49 pm to Dr. Nita Sickle, who verbally acknowledged these results. Electronically Signed   By: Roanna Raider M.D.   On: 02/09/2017 23:50   Dg Shoulder  Left  Result Date: 02/10/2017 CLINICAL DATA:  Left shoulder pain EXAM: LEFT SHOULDER - 2+ VIEW COMPARISON:  None. FINDINGS: There is no evidence of fracture or dislocation. There is no evidence of arthropathy or other focal bone abnormality. Soft tissues are unremarkable. Left apical pleural and parenchymal disease. IMPRESSION: No acute osseous abnormality Electronically Signed   By: Jasmine Pang M.D.   On: 02/10/2017 03:20    Positive ROS: All other systems have been reviewed and were otherwise negative with the exception of those mentioned in the HPI and as above.  Physical Exam: General:  Alert, no acute distress Psychiatric:  Patient is competent for consent with normal mood and affect   Cardiovascular:  No pedal edema Respiratory:  No wheezing, non-labored breathing GI:  Abdomen is soft and non-tender Skin:  No lesions in the area of chief complaint Neurologic:  Sensation intact distally Lymphatic:  No axillary or cervical lymphadenopathy  Orthopedic Exam:  Orthopedic examination is limited to the cervical spine as well as to the left shoulder and upper extremity.  Examination of the cervical spine demonstrates full range of motion with mild "soreness" at the extremes of all motions.  He has minimal tenderness to palpation along the posterior cervical spine, as well as more mild pain to the right paracervical region with a Spurling's test on the right.  He has negative Spurling's test bilaterally in that neither Spurling's test causes radiation of pain to  either the left or right upper extremity.  Examination of the left shoulder shows no skin abnormalities.  There is no swelling, erythema, ecchymosis, abrasions, etc.  He has mild tenderness to palpation over the anterolateral aspect of the shoulder.  He demonstrates essentially full range of motion actively as compared to the right.  He is able to forward flex to 165 degrees, abduction to 160 degrees, and internally rotate to T12.  At 90  degrees of abduction, he can tolerate external rotation to 90 degrees and internal rotation to 70 degrees.  He has mild soreness with forward flexion and abduction.  He has minimal discomfort with impingement maneuver as well as with a crossed arm test.  He has a negative apprehension test.  He exhibits 4+/5 strength with resisted forward flexion, abduction, internal rotation, and external rotation.  He is neurovascularly intact to the left upper extremity and hand.  X-rays:  Internally and externally rotated AP views, as well as a Y-scapular view of the left shoulder are available for review.  These films demonstrate no evidence for fractures, lytic lesions, or significant degenerative changes.  Assessment: Impingement/tendinopathy left shoulder.  Plan: The treatment options are discussed with the patient, utilizing his daughter as an interpreter.  Based on his examination findings, I feel that the likelihood of a rotator cuff tear is low, given his good range of motion and strength.  With the patient is offered but declines formal physical therapy at this time.  However, the patient is offered and accepts a steroid injection into the left subacromial space.  After obtaining verbal consent, this injection is performed sterilely using 1 cc of Kenalog-40 and 4 cc of 0.5% Sensorcaine.  The patient tolerated the procedure well.  Thank you for asked me to participate in the care of this most unfortunate man.  I will be happy to see him back in my office in a month or so if his shoulder symptoms persist.   Maryagnes AmosJ. Jeffrey Poggi, MD  Beeper #:  [id number]  02/10/2017 5:43 PM

## 2017-02-10 NOTE — Progress Notes (Signed)
Language line does not have "Jewel Baizehin Falam", Burmese language available; Daughters at bedside will agree to be interpreter while patient here in hospital; will try again in am for proper interpreter services; Windy Carinaurner,Matyas Baisley K, RN 3:35 AM,02/10/2017

## 2017-02-11 LAB — HIV ANTIBODY (ROUTINE TESTING W REFLEX): HIV Screen 4th Generation wRfx: NONREACTIVE

## 2017-02-12 NOTE — Discharge Summary (Signed)
Bradley Morrow, is a 57 y.o. male  DOB 1960/01/22  MRN [id number].  Admission date:  02/09/2017  Admitting Physician  Oralia Manis, MD  Discharge Date:  02/10/2017   Primary MD  Services, [facility]  Recommendations for primary care physician for things to follow:   Follow-up with PCP in 1 week   Admission Diagnosis  Influenza A [J10.1] Acute respiratory failure with hypoxia (HCC) [J96.01] Exacerbation of asthma, unspecified asthma severity, unspecified whether persistent [J45.901]   Discharge Diagnosis  Influenza A [J10.1] Acute respiratory failure with hypoxia (HCC) [J96.01] Exacerbation of asthma, unspecified asthma severity, unspecified whether persistent [J45.901]   Principal Problem:   Influenza A Active Problems:   Asthma exacerbation   History of tuberculosis      Past Medical History:  Diagnosis Date  . Asthma   . Tuberculosis     Past Surgical History:  Procedure Laterality Date  . NO PAST SURGERIES         History of present illness and  Hospital Course:     Kindly see H&P for history of present illness and admission details, please review complete Labs, Consult reports and Test reports for all details in brief  HPI  from the history and physical done on the day of admission 58 year old Burmese speaking male brought in because of fever, wheezing, found to have influenza type a.  Patient also been having left shoulder pain.  Patient had chest CAT scan because of abnormal chest x-ray, chest CT showed fibrotic changes and sequela of TB versus active TB.  Because of that.  Patient is placed on airborne isolation.Marland Kitchen   Hospital Course  #1 influenza type E: Started on Tamiflu, droplet precautions, discharge home with Tamiflu finished a course of 75 mg p.o. daily for 5 days. 2.  Asthma exacerbation  patient had some wheezing in the right side, improved with bronchodilators.  Discharged home with albuterol, prednisone dose taper. #3 left shoulder pain: Seen by Dr. Leeroy Bock from orthopedic, patient had x-rays of the left shoulder which did not show any fracture.  Patient received a steroid injection to the left shoulder for tendinopathy of left shoulder.  Patient can see Dr. Joice Lofts in the office if shoulder symptoms persist. : Previous TB, patient received treatment 18 months for TB, CT chest concerning for active TB/ scarring, so patient placed in airborne isolation, and consulted by Dr. Sampson Goon, he recommended no further workup for TB or  isolation Follow UP  Follow-up Information    Services, [facility]. Call.   Why:  Please call on Monday and schedule a followup with your primary care physician next week. Contact information: [address] [place] [address] [phone]             Discharge Instructions  and  Discharge Medications      Allergies as of 02/10/2017   No Known Allergies     Medication List    TAKE these medications   albuterol 108 (90 Base) MCG/ACT inhaler Commonly known as:  PROVENTIL HFA;VENTOLIN HFA Inhale 2 puffs into the lungs every 6 (six) hours as needed for wheezing or shortness of breath.   feeding supplement (ENSURE ENLIVE) Liqd Take 237 mLs by mouth 2 (two) times daily between meals.   oseltamivir 75 MG capsule Commonly known as:  TAMIFLU Take 1 capsule (75 mg total) by mouth 2 (two) times daily.   predniSONE 10 MG (21) Tbpk tablet Commonly known as:  STERAPRED UNI-PAK 21 TAB Taper by 10 mg daily  Diet and Activity recommendation: See Discharge Instructions above   Consults obtained -ortho, ID   Major procedures and Radiology Reports - PLEASE rev detailed and final reports for all details, in brief -      Dg Chest 2 View  Result Date: 02/09/2017 CLINICAL DATA:  Productive cough with white sputum. History of  asthma and TB. EXAM: CHEST  2 VIEW COMPARISON:  None. FINDINGS: There is volume loss in the left hemithorax with shift of the heart mediastinum to the left. There is pleural thickening in the apex. There appear to be cystic changes in the left lung as well. No pneumothorax. The right lung is clear. IMPRESSION: 1. Volume loss in the left hemithorax with pleural thickening, cystic lung change, and diffuse mild haziness. While indeterminate, these findings could be chronic from the patient's history of previous TB. An acute on chronic process would be difficult to exclude. 2. No other abnormalities. Electronically Signed   By: Gerome Samavid  Williams III M.D   On: 02/09/2017 20:44   Ct Chest W Contrast  Result Date: 02/09/2017 CLINICAL DATA:  Acute onset of productive cough. Shortness of breath. EXAM: CT CHEST WITH CONTRAST TECHNIQUE: Multidetector CT imaging of the chest was performed during intravenous contrast administration. CONTRAST:  75mL ISOVUE-300 IOPAMIDOL (ISOVUE-300) INJECTION 61% COMPARISON:  Chest radiograph performed earlier today at 8:33 p.m. FINDINGS: Cardiovascular: The heart is normal in size. The thoracic aorta is unremarkable in appearance. The great vessels are within normal limits. Mediastinum/Nodes: There is leftward shift of the mediastinum, reflecting left-sided volume loss. A mildly prominent 1.1 cm subcarinal node is noted. No pericardial effusion is identified. The thyroid gland is unremarkable. No axillary lymphadenopathy is seen. Lungs/Pleura: Numerous cysts are noted occupying the left lung, with underlying diffuse peribronchial thickening. There is dense scarring involving much of the left lung apex. This likely reflects sequelae of the patient's tuberculosis. Acute tuberculosis is a concern, given the patient's symptoms. Minimal nodular peribronchovascular opacities are noted at the right upper and middle lung lobes, concerning for infection. The right lung is otherwise clear. No pleural  effusion or pneumothorax is seen. Upper Abdomen: The visualized portions of the liver and spleen are unremarkable. The pancreatic duct is borderline prominent. The visualized portions of the adrenal glands and kidneys are within normal limits. Musculoskeletal: No acute osseous abnormalities are identified. The visualized musculature is unremarkable in appearance. IMPRESSION: 1. Numerous cysts occupying the left lung, with underlying diffuse peribronchial thickening. Dense scarring involving much of the left lung apex. This likely reflects sequelae of the patient's tuberculosis. Given the patient's symptoms, a active tuberculosis is a concern. 2. Minimal nodular peribronchovascular opacities at the right upper and middle lung lobes, also concerning for infection. 3. Mildly prominent 1.1 cm subcarinal node. These results were called by telephone at the time of interpretation on 02/09/2017 at 11:49 pm to Dr. Nita SickleAROLINA VERONESE, who verbally acknowledged these results. Electronically Signed   By: Roanna RaiderJeffery  Chang M.D.   On: 02/09/2017 23:50   Dg Shoulder Left  Result Date: 02/10/2017 CLINICAL DATA:  Left shoulder pain EXAM: LEFT SHOULDER - 2+ VIEW COMPARISON:  None. FINDINGS: There is no evidence of fracture or dislocation. There is no evidence of arthropathy or other focal bone abnormality. Soft tissues are unremarkable. Left apical pleural and parenchymal disease. IMPRESSION: No acute osseous abnormality Electronically Signed   By: Jasmine PangKim  Fujinaga M.D.   On: 02/10/2017 03:20    Micro Results    No results found for this or any previous visit (  from the past 240 hour(s)).     Today   Subjective:   Bradley Morrow today has no headache,no chest abdominal pain,no new weakness tingling or numbness, feels much better wants to go home today.  Objective:   Blood pressure (!) 107/54, pulse (!) 52, temperature 98 F (36.7 C), temperature source Oral, resp. rate 16, height 5\' 8"  (1.727 m), weight 53.7 kg (118 lb 6.4  oz), SpO2 96 %.  No intake or output data in the 24 hours ending 02/12/17 1532  Exam Awake Alert, Oriented x 3, No new F.N deficits, Normal affect [place].AT,PERRAL Supple Neck,No JVD, No cervical lymphadenopathy appriciated.  Symmetrical Chest wall movement, Good air movement bilaterally, CTAB RRR,No Gallops,Rubs or new Murmurs, No Parasternal Heave +ve B.Sounds, Abd Soft, Non tender, No organomegaly appriciated, No rebound -guarding or rigidity. No Cyanosis, Clubbing or edema, No new Rash or bruise  Data Review   CBC w Diff:  Lab Results  Component Value Date   WBC 5.5 02/10/2017   HGB 12.6 (L) 02/10/2017   HCT 37.7 (L) 02/10/2017   PLT 125 (L) 02/10/2017   LYMPHOPCT 12 02/09/2017   MONOPCT 8 02/09/2017   EOSPCT 0 02/09/2017   BASOPCT 0 02/09/2017    CMP:  Lab Results  Component Value Date   NA 143 02/10/2017   K 4.0 02/10/2017   CL 111 02/10/2017   CO2 24 02/10/2017   BUN 14 02/10/2017   CREATININE 0.96 02/10/2017   PROT 7.0 02/09/2017   ALBUMIN 3.5 02/09/2017   BILITOT 0.4 02/09/2017   ALKPHOS 49 02/09/2017   AST 41 02/09/2017   ALT 19 02/09/2017  .   Total Time in preparing paper work, data evaluation and todays exam - 35 minutes  Katha Hamming M.D on2/08//2019 at 3:32 PM    Note: This dictation was prepared with Dragon dictation along with smaller phrase technology. Any transcriptional errors that result from this process are unintentional.

## 2017-02-14 LAB — ACID FAST SMEAR (AFB, MYCOBACTERIA)

## 2017-02-14 LAB — ACID FAST SMEAR (AFB): ACID FAST SMEAR - AFSCU2: NEGATIVE

## 2017-03-27 LAB — ACID FAST CULTURE WITH REFLEXED SENSITIVITIES: ACID FAST CULTURE - AFSCU3: NEGATIVE

## 2018-08-13 ENCOUNTER — Other Ambulatory Visit: Payer: Self-pay

## 2018-08-13 DIAGNOSIS — Z20822 Contact with and (suspected) exposure to covid-19: Secondary | ICD-10-CM

## 2018-08-14 LAB — NOVEL CORONAVIRUS, NAA: SARS-CoV-2, NAA: NOT DETECTED

## 2018-08-23 ENCOUNTER — Other Ambulatory Visit: Payer: Self-pay

## 2018-08-23 DIAGNOSIS — Z20822 Contact with and (suspected) exposure to covid-19: Secondary | ICD-10-CM

## 2018-08-24 LAB — NOVEL CORONAVIRUS, NAA: SARS-CoV-2, NAA: NOT DETECTED

## 2019-02-11 IMAGING — CR DG CHEST 2V
1 series · 2 of 2 positions shown · non-contrast
Comparison: None.

CLINICAL DATA: Productive cough with white sputum. History of
asthma and TB.

EXAM:
CHEST  2 VIEW

[Series 1: dg chest 2 view · 0.14mm/px · 2 of 2 slices shown]
[im 1/2]
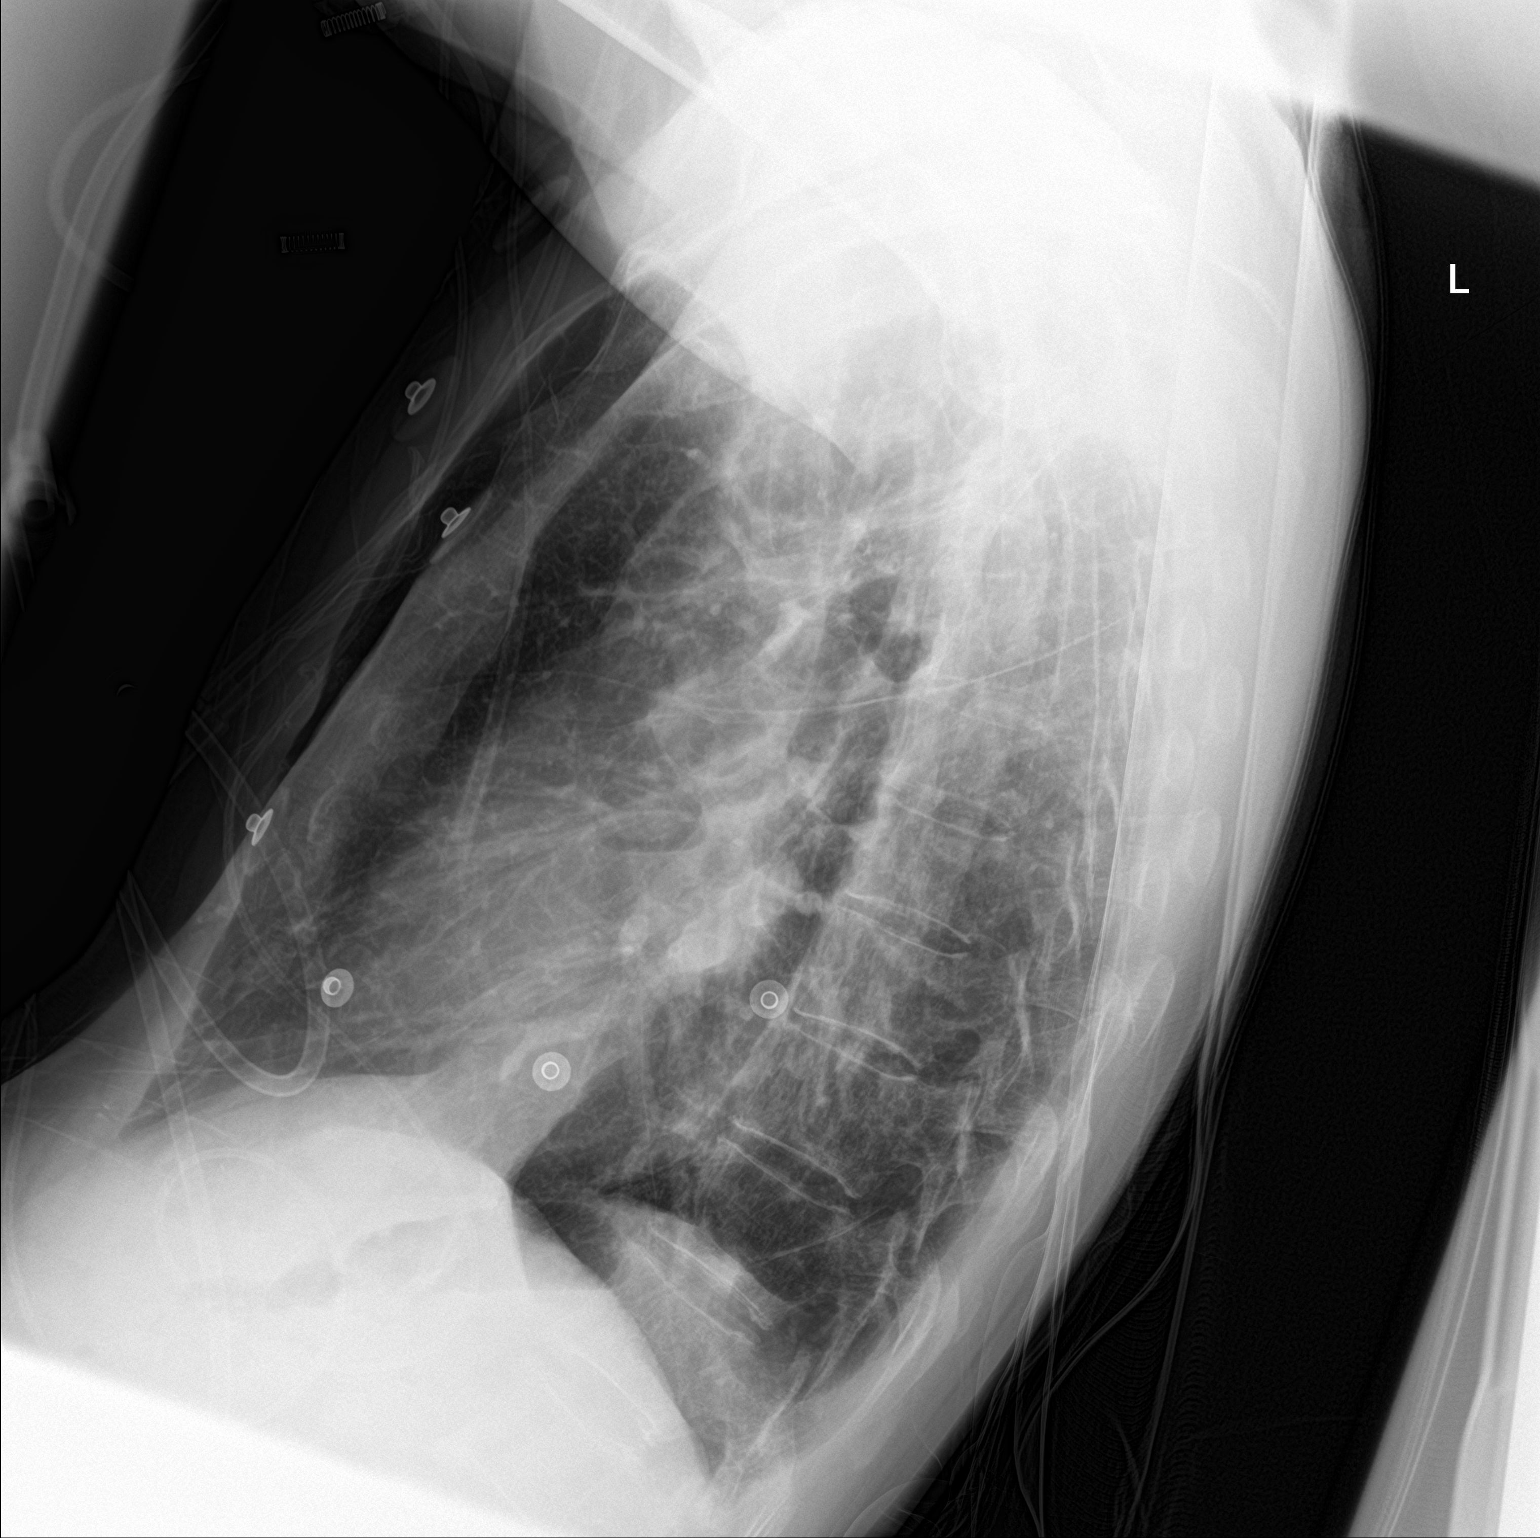
[im 2/2]
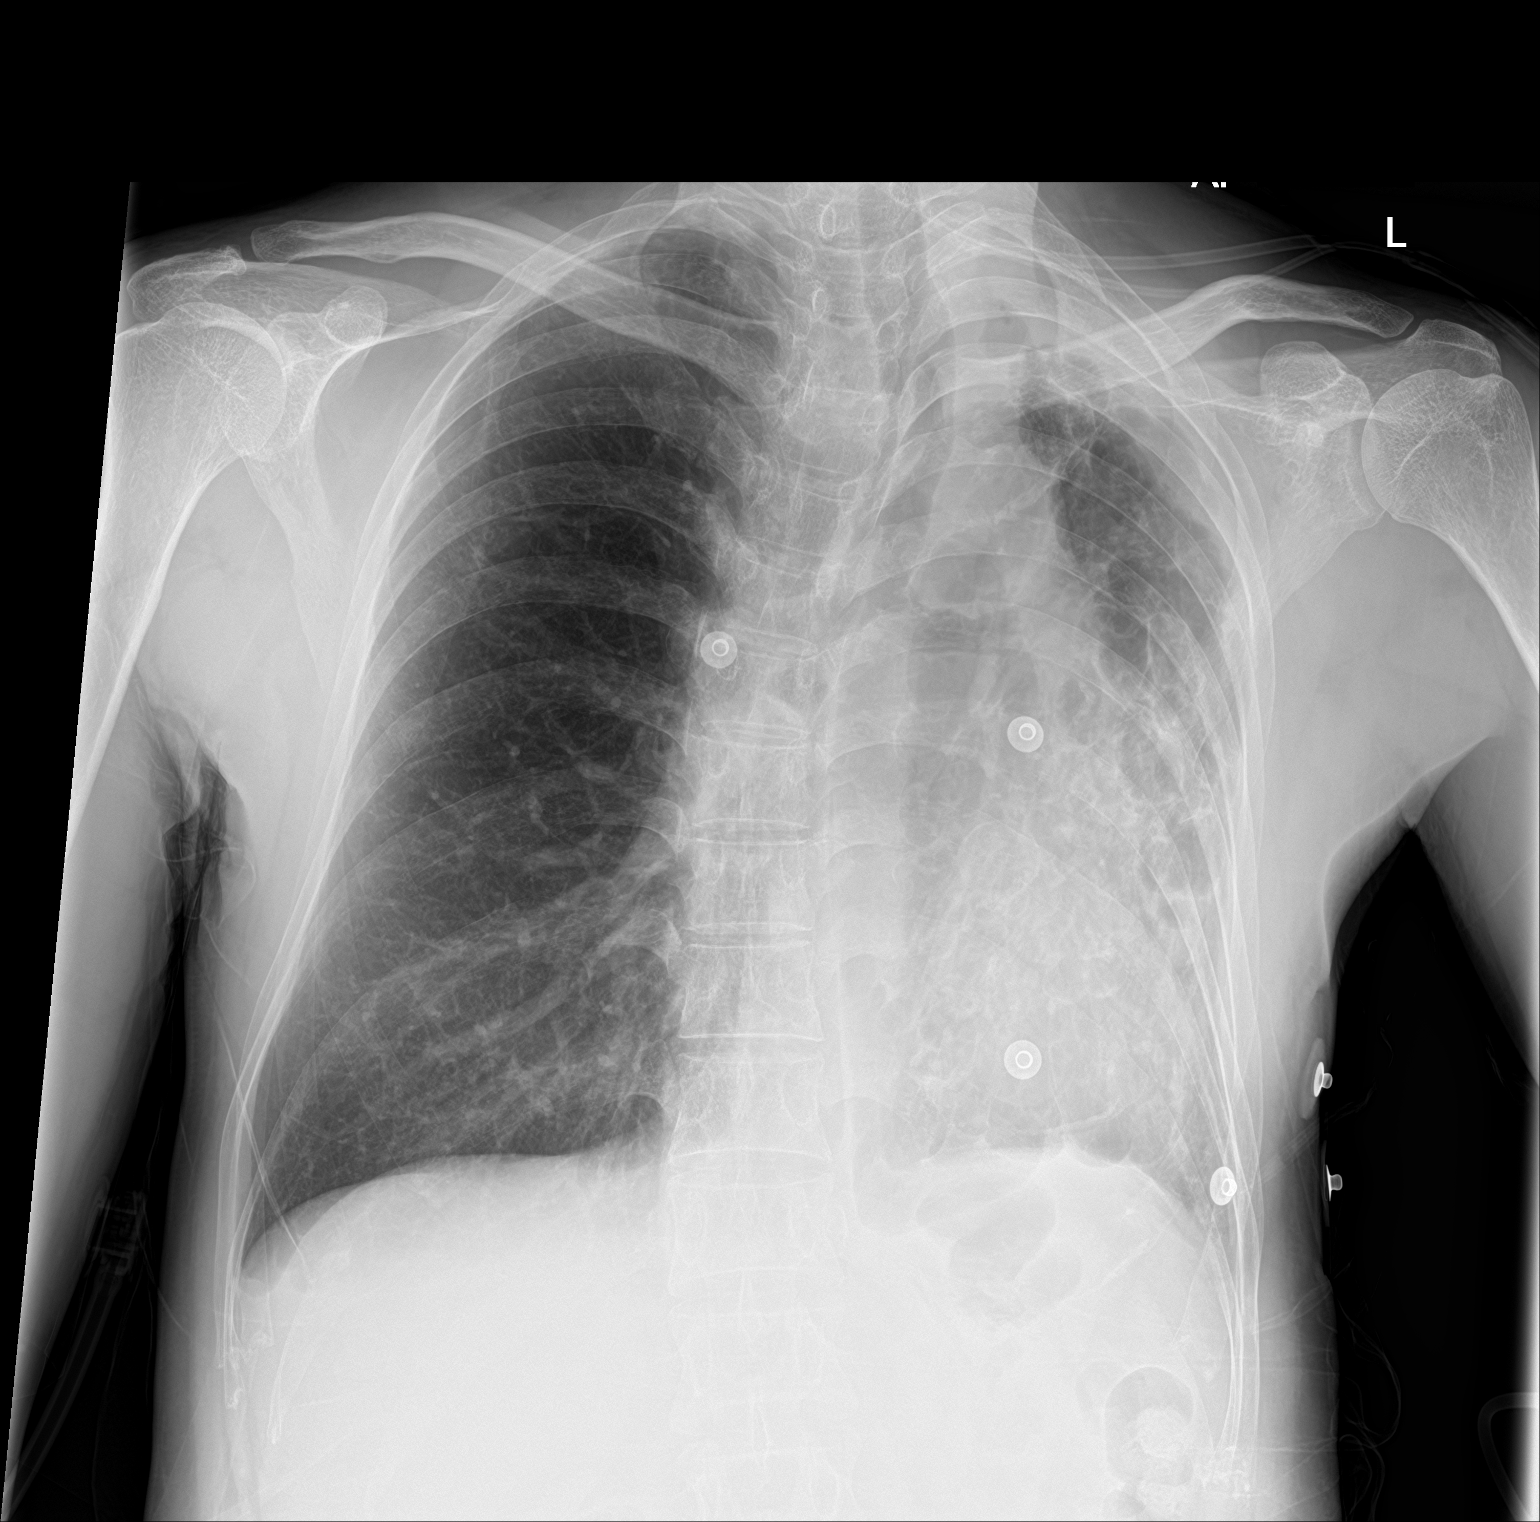

[2 of 2 positions shown; findings below may reference images not displayed]

FINDINGS: There is volume loss in the left hemithorax with shift of the heart
mediastinum to the left. There is pleural thickening in the apex.
There appear to be cystic changes in the left lung as well. No
pneumothorax. The right lung is clear.
IMPRESSION: 1. Volume loss in the left hemithorax with pleural thickening,
cystic lung change, and diffuse mild haziness. While indeterminate,
these findings could be chronic from the patient's history of
previous TB. An acute on chronic process would be difficult to
exclude.
2. No other abnormalities.

## 2019-02-12 IMAGING — DX DG SHOULDER 2+V*L*
3 series · 3 of 3 positions shown · non-contrast
Comparison: None.

CLINICAL DATA: Left shoulder pain

EXAM:
LEFT SHOULDER - 2+ VIEW

[shoulder axial]
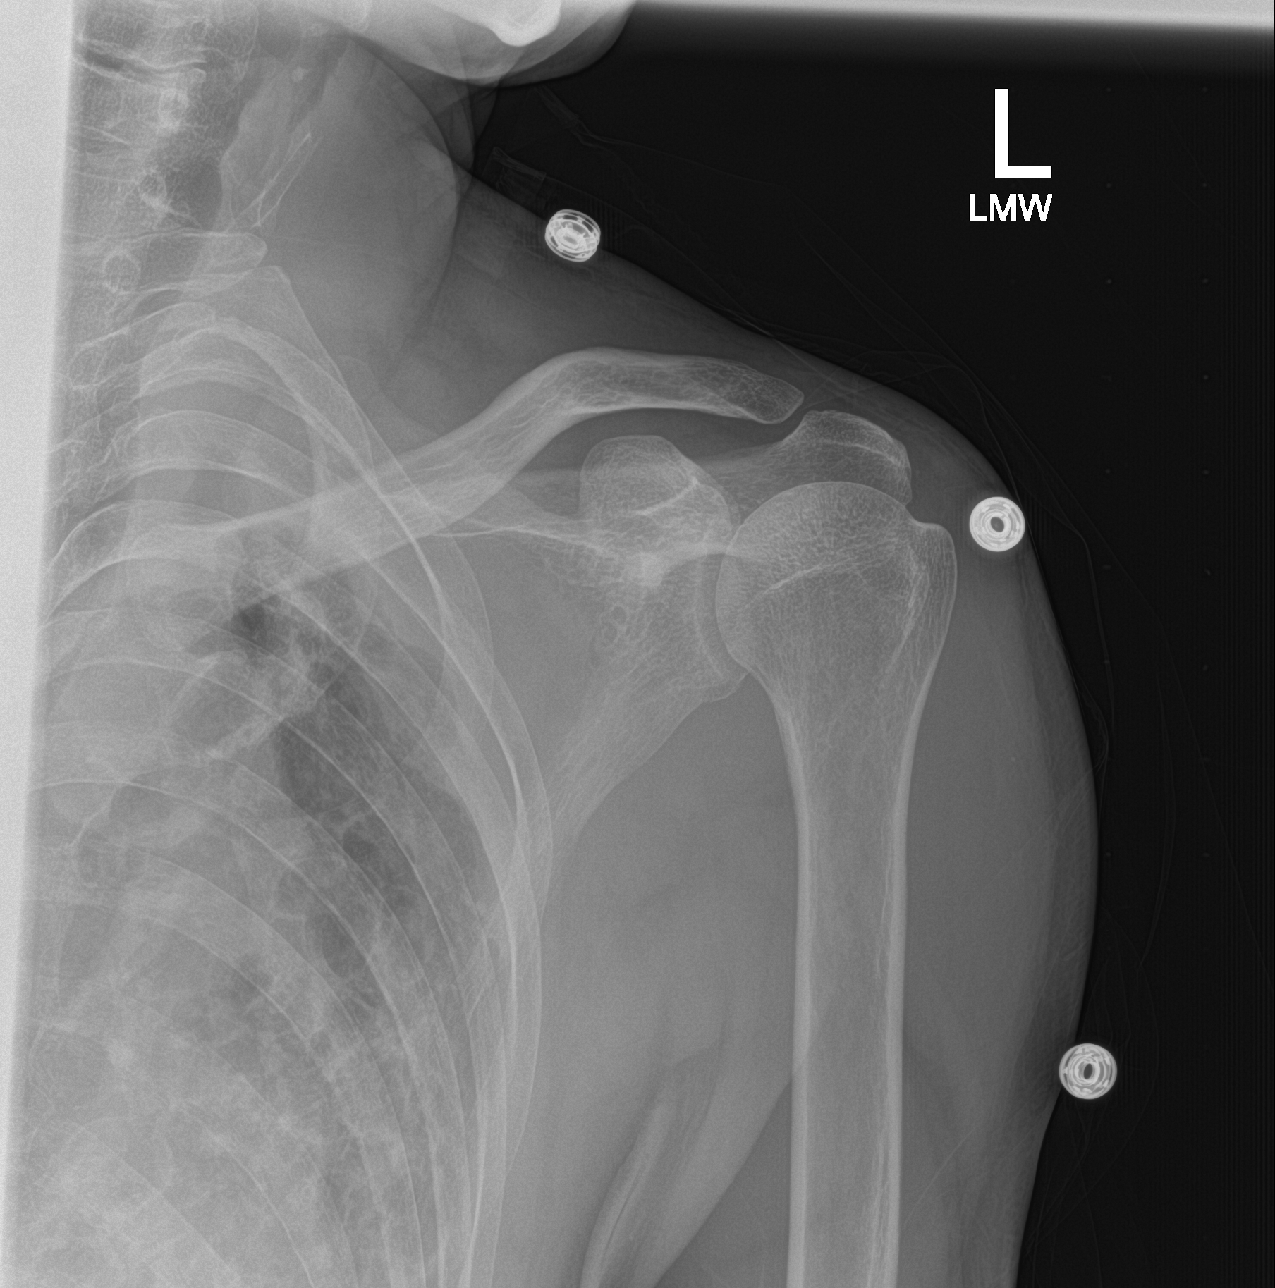

[shoulder swimmer]
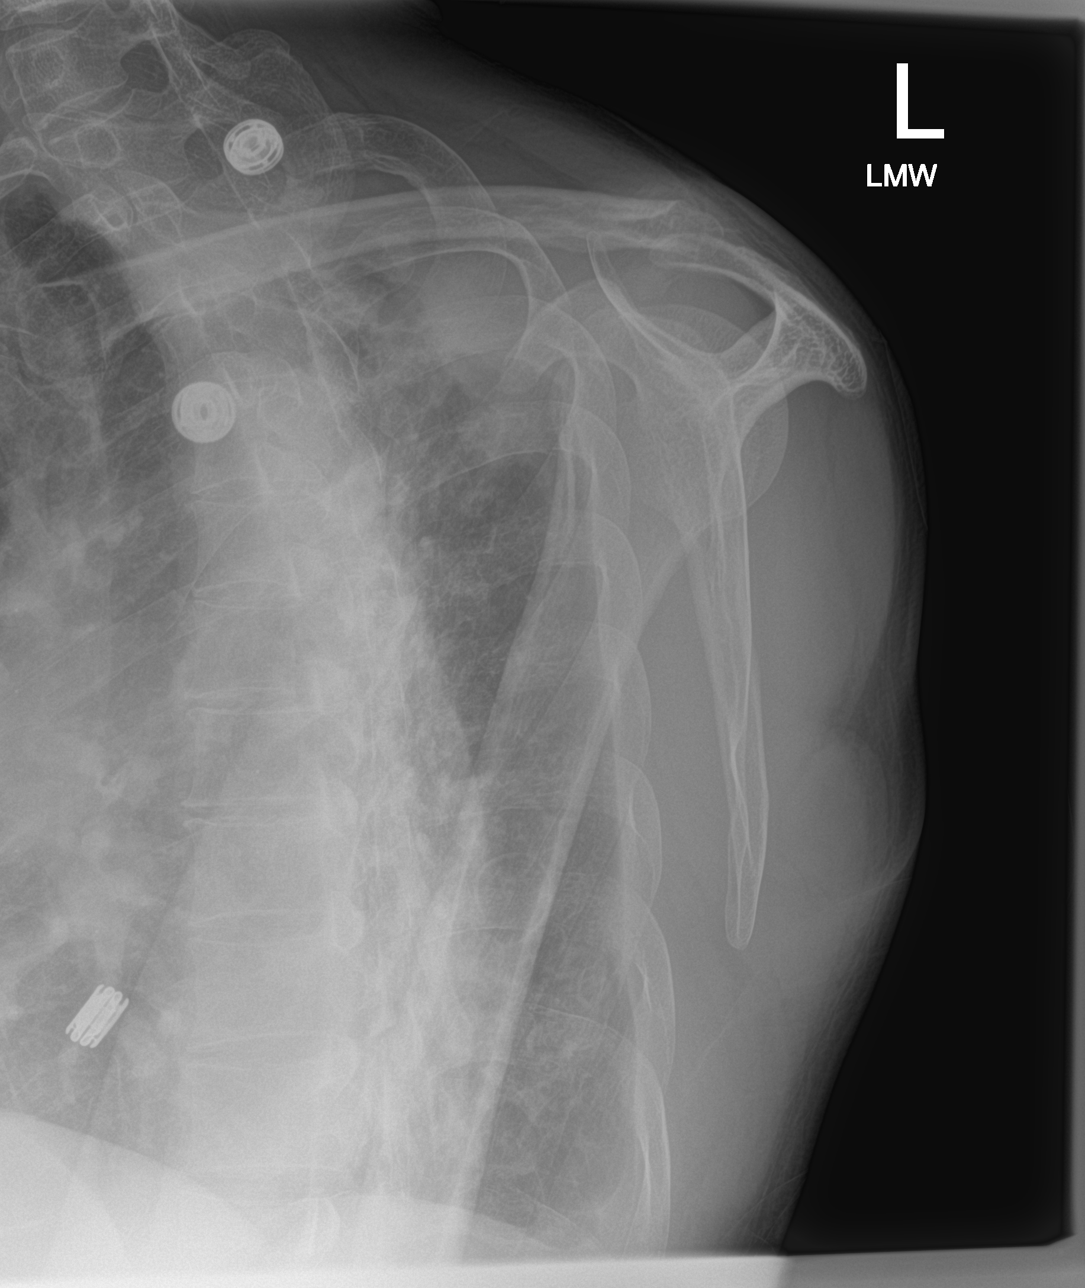

[shoulder ap]
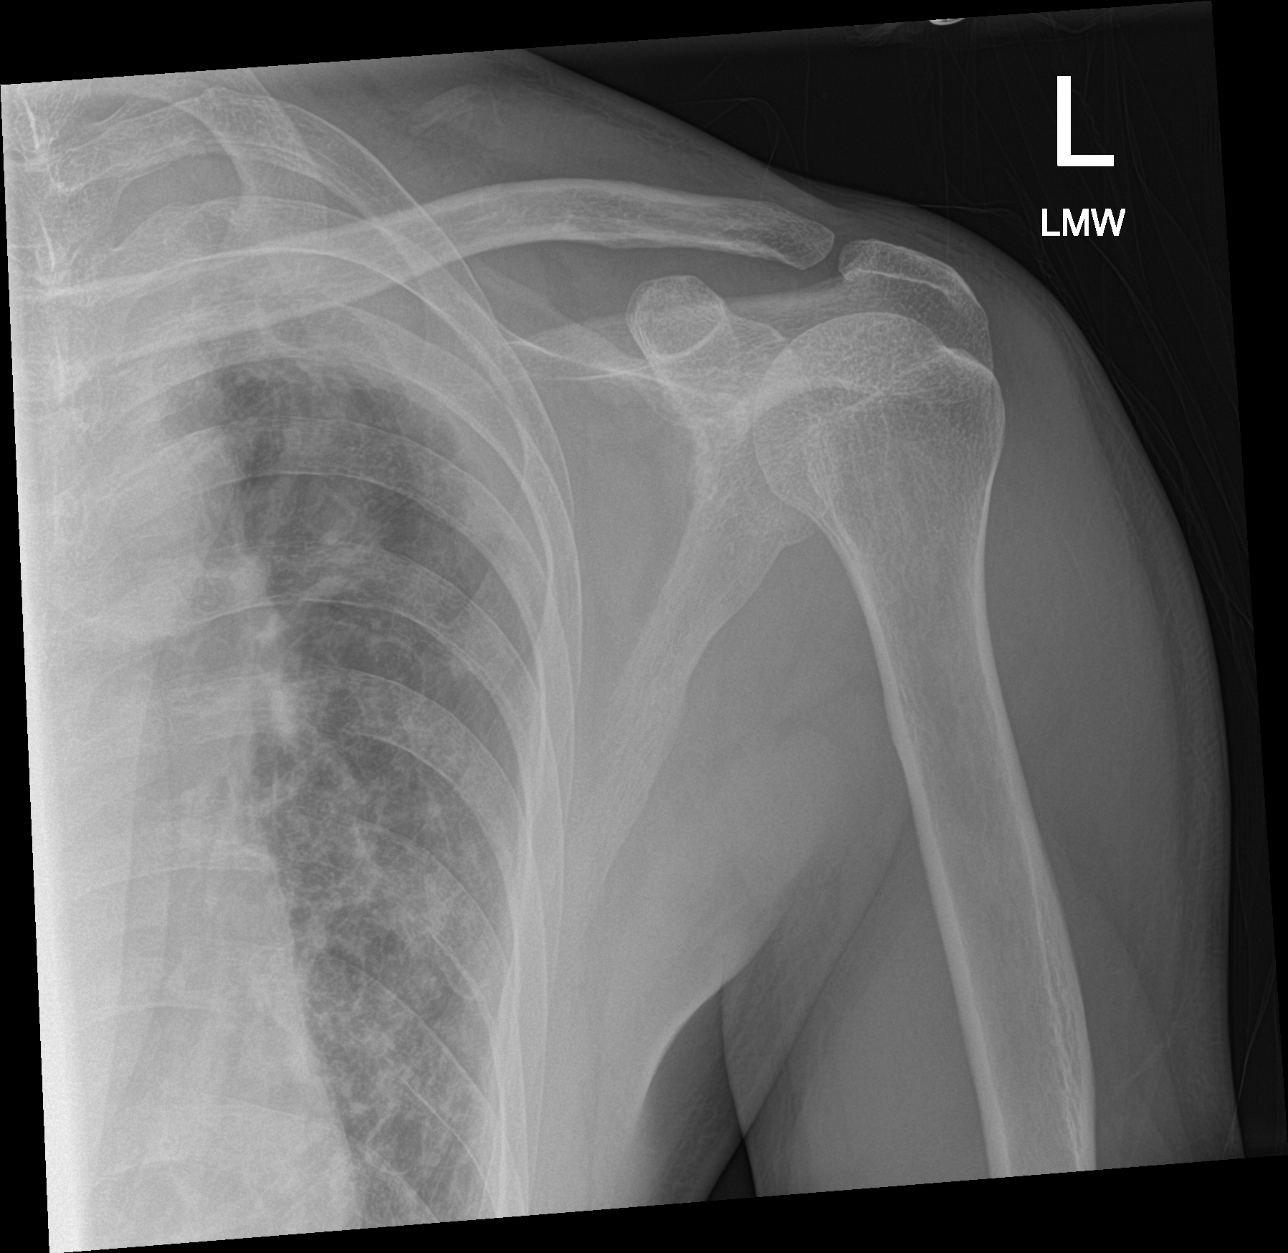

[3 of 3 positions shown; findings below may reference images not displayed]

FINDINGS: There is no evidence of fracture or dislocation. There is no
evidence of arthropathy or other focal bone abnormality. Soft
tissues are unremarkable. Left apical pleural and parenchymal
disease.
IMPRESSION: No acute osseous abnormality

## 2019-04-06 ENCOUNTER — Ambulatory Visit: Payer: Self-pay | Attending: Internal Medicine

## 2019-04-06 DIAGNOSIS — Z23 Encounter for immunization: Secondary | ICD-10-CM

## 2019-04-06 NOTE — Progress Notes (Signed)
   Covid-19 Vaccination Clinic  Name:  Bradley Morrow    MRN: [id number] DOB: 05/03/1960  04/06/2019  Bradley Morrow was observed post Covid-19 immunization for 15 minutes without incident. He was provided with Vaccine Information Sheet and instruction to access the V-Safe system.   Bradley Morrow was instructed to call 911 with any severe reactions post vaccine: Marland Kitchen Difficulty breathing  . Swelling of face and throat  . A fast heartbeat  . A bad rash all over body  . Dizziness and weakness   Immunizations Administered    Name Date Dose VIS Date Route   Pfizer COVID-19 Vaccine 04/06/2019 11:27 AM 0.3 mL 12/14/2018 Intramuscular   Manufacturer: [employer], [employer]   Lot: [id number]   NDC: [address]-7

## 2019-04-30 ENCOUNTER — Ambulatory Visit: Payer: Self-pay | Attending: Internal Medicine

## 2019-04-30 DIAGNOSIS — Z23 Encounter for immunization: Secondary | ICD-10-CM

## 2019-04-30 NOTE — Progress Notes (Signed)
   Covid-19 Vaccination Clinic  Name:  Bradley Morrow    MRN: [id number] DOB: Apr 23, 1960  04/30/2019  [name] was observed post Covid-19 immunization for 15 minutes without incident. He was provided with Vaccine Information Sheet and instruction to access the V-Safe system.   [name] was instructed to call 911 with any severe reactions post vaccine: Marland Kitchen Difficulty breathing  . Swelling of face and throat  . A fast heartbeat  . A bad rash all over body  . Dizziness and weakness   Immunizations Administered    Name Date Dose VIS Date Route   Pfizer COVID-19 Vaccine 04/30/2019 10:21 AM 0.3 mL 02/27/2018 Intramuscular   Manufacturer: [employer], [employer]   Lot: [id number]   NDC: [address]-4

## 2022-07-21 DIAGNOSIS — Z1389 Encounter for screening for other disorder: Secondary | ICD-10-CM | POA: Diagnosis not present

## 2022-07-21 DIAGNOSIS — H9391 Unspecified disorder of right ear: Secondary | ICD-10-CM | POA: Diagnosis not present

## 2022-07-21 DIAGNOSIS — Z20822 Contact with and (suspected) exposure to covid-19: Secondary | ICD-10-CM | POA: Diagnosis not present

## 2022-07-21 DIAGNOSIS — J479 Bronchiectasis, uncomplicated: Secondary | ICD-10-CM | POA: Diagnosis not present

## 2022-08-15 DIAGNOSIS — J479 Bronchiectasis, uncomplicated: Secondary | ICD-10-CM | POA: Diagnosis not present

## 2022-08-15 DIAGNOSIS — Z1159 Encounter for screening for other viral diseases: Secondary | ICD-10-CM | POA: Diagnosis not present

## 2022-08-15 DIAGNOSIS — Z1331 Encounter for screening for depression: Secondary | ICD-10-CM | POA: Diagnosis not present

## 2022-08-15 DIAGNOSIS — Z20822 Contact with and (suspected) exposure to covid-19: Secondary | ICD-10-CM | POA: Diagnosis not present

## 2022-08-15 DIAGNOSIS — H538 Other visual disturbances: Secondary | ICD-10-CM | POA: Diagnosis not present

## 2022-08-15 DIAGNOSIS — H60391 Other infective otitis externa, right ear: Secondary | ICD-10-CM | POA: Diagnosis not present

## 2022-08-15 DIAGNOSIS — Z13220 Encounter for screening for lipoid disorders: Secondary | ICD-10-CM | POA: Diagnosis not present

## 2022-08-15 DIAGNOSIS — R7303 Prediabetes: Secondary | ICD-10-CM | POA: Diagnosis not present

## 2022-08-15 DIAGNOSIS — Z131 Encounter for screening for diabetes mellitus: Secondary | ICD-10-CM | POA: Diagnosis not present

## 2022-08-15 DIAGNOSIS — Z1389 Encounter for screening for other disorder: Secondary | ICD-10-CM | POA: Diagnosis not present

## 2022-08-15 DIAGNOSIS — Z23 Encounter for immunization: Secondary | ICD-10-CM | POA: Diagnosis not present

## 2022-08-15 DIAGNOSIS — Z0000 Encounter for general adult medical examination without abnormal findings: Secondary | ICD-10-CM | POA: Diagnosis not present

## 2022-09-19 DIAGNOSIS — J479 Bronchiectasis, uncomplicated: Secondary | ICD-10-CM | POA: Diagnosis not present

## 2022-10-19 DIAGNOSIS — Z1212 Encounter for screening for malignant neoplasm of rectum: Secondary | ICD-10-CM | POA: Diagnosis not present

## 2022-10-19 DIAGNOSIS — Z1211 Encounter for screening for malignant neoplasm of colon: Secondary | ICD-10-CM | POA: Diagnosis not present

## 2022-12-21 DIAGNOSIS — Z1389 Encounter for screening for other disorder: Secondary | ICD-10-CM | POA: Diagnosis not present

## 2022-12-21 DIAGNOSIS — Z1159 Encounter for screening for other viral diseases: Secondary | ICD-10-CM | POA: Diagnosis not present

## 2022-12-21 DIAGNOSIS — H538 Other visual disturbances: Secondary | ICD-10-CM | POA: Diagnosis not present

## 2022-12-21 DIAGNOSIS — R079 Chest pain, unspecified: Secondary | ICD-10-CM | POA: Diagnosis not present

## 2022-12-21 DIAGNOSIS — J4530 Mild persistent asthma, uncomplicated: Secondary | ICD-10-CM | POA: Diagnosis not present

## 2022-12-21 DIAGNOSIS — Z20822 Contact with and (suspected) exposure to covid-19: Secondary | ICD-10-CM | POA: Diagnosis not present

## 2023-01-02 DIAGNOSIS — Z20822 Contact with and (suspected) exposure to covid-19: Secondary | ICD-10-CM | POA: Diagnosis not present

## 2023-01-02 DIAGNOSIS — Z1159 Encounter for screening for other viral diseases: Secondary | ICD-10-CM | POA: Diagnosis not present

## 2023-01-02 DIAGNOSIS — Z1389 Encounter for screening for other disorder: Secondary | ICD-10-CM | POA: Diagnosis not present

## 2023-01-02 DIAGNOSIS — J4530 Mild persistent asthma, uncomplicated: Secondary | ICD-10-CM | POA: Diagnosis not present

## 2023-01-02 DIAGNOSIS — Z23 Encounter for immunization: Secondary | ICD-10-CM | POA: Diagnosis not present

## 2023-01-05 DIAGNOSIS — J479 Bronchiectasis, uncomplicated: Secondary | ICD-10-CM | POA: Diagnosis not present

## 2023-01-05 DIAGNOSIS — Z1389 Encounter for screening for other disorder: Secondary | ICD-10-CM | POA: Diagnosis not present

## 2023-01-11 DIAGNOSIS — J479 Bronchiectasis, uncomplicated: Secondary | ICD-10-CM | POA: Diagnosis not present

## 2023-01-26 DIAGNOSIS — J479 Bronchiectasis, uncomplicated: Secondary | ICD-10-CM | POA: Diagnosis not present

## 2023-01-26 DIAGNOSIS — L309 Dermatitis, unspecified: Secondary | ICD-10-CM | POA: Diagnosis not present

## 2023-01-26 DIAGNOSIS — L219 Seborrheic dermatitis, unspecified: Secondary | ICD-10-CM | POA: Diagnosis not present

## 2023-01-26 DIAGNOSIS — Z1389 Encounter for screening for other disorder: Secondary | ICD-10-CM | POA: Diagnosis not present

## 2023-01-26 DIAGNOSIS — Z23 Encounter for immunization: Secondary | ICD-10-CM | POA: Diagnosis not present

## 2023-03-23 DIAGNOSIS — Z1389 Encounter for screening for other disorder: Secondary | ICD-10-CM | POA: Diagnosis not present

## 2023-03-23 DIAGNOSIS — Z7189 Other specified counseling: Secondary | ICD-10-CM | POA: Diagnosis not present

## 2023-05-08 ENCOUNTER — Ambulatory Visit (INDEPENDENT_AMBULATORY_CARE_PROVIDER_SITE_OTHER): Payer: Self-pay

## 2023-05-08 ENCOUNTER — Ambulatory Visit
Admission: EM | Admit: 2023-05-08 | Discharge: 2023-05-08 | Disposition: A | Discharge status: Home / Self Care | Payer: Self-pay | Attending: Emergency Medicine | Admitting: Emergency Medicine

## 2023-05-08 ENCOUNTER — Encounter: Payer: Self-pay | Admitting: Emergency Medicine

## 2023-05-08 DIAGNOSIS — J45901 Unspecified asthma with (acute) exacerbation: Secondary | ICD-10-CM

## 2023-05-08 DIAGNOSIS — R5383 Other fatigue: Secondary | ICD-10-CM | POA: Insufficient documentation

## 2023-05-08 DIAGNOSIS — Z20822 Contact with and (suspected) exposure to covid-19: Secondary | ICD-10-CM

## 2023-05-08 DIAGNOSIS — R051 Acute cough: Secondary | ICD-10-CM

## 2023-05-08 LAB — BASIC METABOLIC PANEL WITH GFR
Anion gap: 9 (ref 5–15)
BUN: 13 mg/dL (ref 8–23)
CO2: 27 mmol/L (ref 22–32)
Calcium: 9.4 mg/dL (ref 8.9–10.3)
Chloride: 101 mmol/L (ref 98–111)
Creatinine, Ser: 0.91 mg/dL (ref 0.61–1.24)
GFR, Estimated: >60 mL/min (ref >60)
Glucose, Bld: 95 mg/dL (ref 70–99)
Potassium: 4.3 mmol/L (ref 3.5–5.1)
Sodium: 137 mmol/L (ref 135–145)

## 2023-05-08 LAB — SARS CORONAVIRUS 2 BY RT PCR: SARS Coronavirus 2 by RT PCR: NEGATIVE

## 2023-05-08 MED ORDER — FLUTICASONE PROPIONATE 50 MCG/ACT NA SUSP: 2.0000 | Freq: Every day | NASAL | 0 refills | Status: AC

## 2023-05-08 MED ORDER — ALBUTEROL SULFATE HFA 108 (90 BASE) MCG/ACT IN AERS: 1.0000 | INHALATION_SPRAY | RESPIRATORY_TRACT | 0 refills | Status: AC | PRN

## 2023-05-08 MED ORDER — PREDNISONE 20 MG PO TABS: 40.0000 mg | ORAL_TABLET | Freq: Every day | ORAL | 0 refills | Status: AC

## 2023-05-08 MED ORDER — AEROCHAMBER MV MISC: 1 refills | Status: AC

## 2023-05-08 NOTE — Discharge Instructions (Addendum)
 Your chest x-ray today did not show any acute changes, including a new infection.  We will contact you if COVID or basic metabolic panel come back abnormal and we need to change management.  start an antihistamine such as Claritin, Zyrtec or Allegra, and if that does not work, then Mucinex .  Saline nasal irrigation with a NeilMed sinus rinse and distilled water as often as you want, 2 puffs from your albuterol  inhaler with a spacer every 4-6 hours, Flonase, prednisone  40 mg for 5 days.  Make sure the pharmacy shows you how to use the albuterol  inhaler with a spacer.  Follow-up either with PCP, Dr. Aisha Ali, your pulmonologist at [facility] if not getting better.  Go to the ER for the signs and symptoms we discussed.

## 2023-05-08 NOTE — ED Triage Notes (Signed)
 Pt presents with fatigue x 3 days. Pt reports some runny nose.

## 2023-05-08 NOTE — ED Provider Notes (Signed)
 HPI  SUBJECTIVE:  Bradley Morrow is a 63 y.o. male who presents with 3 days of fatigue, nasal congestion, rhinorrhea, postnasal drip, sore throat, wheezing, shortness of breath, dyspnea on exertion.  Patient reports nausea when he goes outside and 1 episode of emesis.  He is tolerating p.o.  No fevers, sinus pain or pressure, headache, itchy, watery eyes, sneezing, cough, abdominal pain, headache, body aches, chest pain.  No known COVID or flu exposure.  He got the COVID and flu vaccines.  No antibiotics in the past 3 months.  No antipyretic in the past 6 hours.  He has not tried anything for his symptoms.  No alleviating factors.  Symptoms worse with going outside and with exertion.  He has a past medical history of asthma and TB.  He is followed by [facility] pulmonology.  PCP: None.  All history obtained from son in law acting as interpreter.  They declined third-party medical translation.  Past Medical History:  Diagnosis Date   Asthma    Tuberculosis     Past Surgical History:  Procedure Laterality Date   NO PAST SURGERIES      Family History  Family history unknown: Yes    Social History   Tobacco Use   Smoking status: Former    Types: Cigarettes   Smokeless tobacco: Never  Vaping Use   Vaping status: Never Used  Substance Use Topics   Alcohol use: Not Currently   Drug use: No    No current facility-administered medications for this encounter.  Current Outpatient Medications:    ADVAIR DISKUS 250-50 MCG/ACT AEPB, Inhale 1 puff into the lungs 2 (two) times daily., Disp: , Rfl:    albuterol  (VENTOLIN  HFA) 108 (90 Base) MCG/ACT inhaler, Inhale 1-2 puffs into the lungs every 4 (four) hours as needed for wheezing or shortness of breath., Disp: 1 each, Rfl: 0   famotidine (PEPCID) 20 MG tablet, Take 1 tablet by mouth daily., Disp: , Rfl:    fluticasone (FLONASE) 50 MCG/ACT nasal spray, Place 2 sprays into both nostrils daily., Disp: 16 g, Rfl: 0   predniSONE  (DELTASONE ) 20 MG tablet,  Take 2 tablets (40 mg total) by mouth daily with breakfast for 5 days., Disp: 10 tablet, Rfl: 0   Spacer/Aero-Holding Chambers (AEROCHAMBER MV) inhaler, Use as instructed, Disp: 1 each, Rfl: 1   feeding supplement, ENSURE ENLIVE, (ENSURE ENLIVE) LIQD, Take 237 mLs by mouth 2 (two) times daily between meals., Disp: 237 mL, Rfl: 12  No Known Allergies   ROS  As noted in HPI.   Physical Exam  BP 120/76 (BP Location: Left Arm)   Pulse 63   Temp 97.9 F (36.6 C) (Oral)   Resp 16   SpO2 97%   Constitutional: Well developed, well nourished, no acute distress Eyes: PERRL, EOMI, conjunctiva normal bilaterally HENT: Normocephalic, atraumatic,mucus membranes moist.  No nasal congestion.  No maxillary, frontal sinus tenderness.  Normal turbinates.  Normal oropharynx.  No postnasal drip Neck: No cervical lymphadenopathy Respiratory: Fair air movement, clear to auscultation bilaterally, no rales, no wheezing, no rhonchi Cardiovascular: Normal rate and rhythm, no murmurs, no gallops, no rubs GI: nondistended skin: No rash, skin intact Musculoskeletal: no deformities Neurologic: Alert & oriented x 3, CN III-XII grossly intact, no motor deficits, sensation grossly intact Psychiatric: Speech and behavior appropriate   ED Course   Medications - No data to display  Orders Placed This Encounter  Procedures   SARS Coronavirus 2 by RT PCR (hospital order, performed in [facility] hospital lab) *cepheid single result test* Anterior Nasal Swab    Standing Status:   Standing    Number of Occurrences:   1   DG Chest 2 View    Standing Status:   Standing    Number of Occurrences:   1    Reason for Exam (SYMPTOM  OR DIAGNOSIS REQUIRED):   fatigue cough sob r/o PNA   Basic metabolic panel    Standing Status:   Standing    Number of Occurrences:   1   Nursing Communication Please set up with PCP prior to discharge    Please set up with PCP prior to discharge    Standing Status:   Standing     Number of Occurrences:   1   Airborne and Contact precautions    Standing Status:   Standing    Number of Occurrences:   1   Results for orders placed or performed during the hospital encounter of 05/08/23 (from the past 24 hours)  SARS Coronavirus 2 by RT PCR (hospital order, performed in [facility] hospital lab) *cepheid single result test* Anterior Nasal Swab     Status: None   Collection Time: 05/08/23  6:37 PM   Specimen: Anterior Nasal Swab  Result Value Ref Range   SARS Coronavirus 2 by RT PCR NEGATIVE NEGATIVE  Basic metabolic panel     Status: None   Collection Time: 05/08/23  6:37 PM  Result Value Ref Range   Sodium 137 135 - 145 mmol/L   Potassium 4.3 3.5 - 5.1 mmol/L   Chloride 101 98 - 111 mmol/L   CO2 27 22 - 32 mmol/L   Glucose, Bld 95 70 - 99 mg/dL   BUN 13 8 - 23 mg/dL   Creatinine, Ser 1.61 0.61 - 1.24 mg/dL   Calcium 9.4 8.9 - 09.6 mg/dL   GFR, Estimated >04 >54 mL/min   Anion gap 9 5 - 15   DG Chest 2 View Result Date: 05/08/2023 CLINICAL DATA:  Fatigue, cough, shortness of breath. EXAM: CHEST - 2 VIEW COMPARISON:  Radiograph and CT 02/09/2017 FINDINGS: Chronic volume loss in the left hemithorax with pleuroparenchymal scarring at the apex. Ill-defined opacity in the left mid lower lung zone corresponds to bronchiectasis and cystic change on prior CT. No evidence of acute airspace disease. Normal heart size with stable mediastinal contours. No pleural fluid, pulmonary edema, or pneumothorax. IMPRESSION: 1. Chronic volume loss in the left hemithorax with pleuroparenchymal scarring at the apex. Ill-defined opacity in the left mid lower lung zone corresponds to bronchiectasis and cystic change on prior CT. Findings likely related to prior tuberculosis, given history on prior exam of prior TB. 2. No acute findings. Electronically Signed   By: Chadwick Colonel M.D.   On: 05/08/2023 18:53    ED Clinical Impression  1. Acute cough   2. Asthma with acute exacerbation,  unspecified asthma severity, unspecified whether persistent   3. Encounter for laboratory testing for COVID-19 virus   4. Other fatigue      ED Assessment/Plan     Patient presents with respiratory symptoms.  Given his history of asthma, I suspect an asthma exacerbation/allergies from the recent high pollen days.  Will check a COVID, chest x-ray, BMP as there is no record of kidney function in the past 6 months in [facility] or in Care Everywhere.  If COVID is positive, will prescribe Paxlovid.  In the meantime, he is to start an antihistamine such as Claritin, Zyrtec  or Allegra, and if that does not work, then Mucinex .  Saline nasal irrigation, 2 puffs from an albuterol  inhaler with a spacer every 4-6 hours, Flonase, prednisone  40 mg for 5 days.  Will set up with PCP prior to discharge.  Follow-up either with his pulmonologist at [facility] if not improving.  ER return precautions given to patient and family member.  Will contact son-in-law Joanette Moynahan [phone] for any abnormalities.  Reviewed imaging independently.  No pneumonia or acute findings per radiology.. See radiology report for full details.  Discussed results with patient and family while in department.  Basic metabolic panel normal.  COVID-negative.  Plan as above.  Discussed labs, imaging, MDM, treatment plan, and plan for follow-up with patient and family member.  Discussed sn/sx that should prompt return to the ED.  They agree with plan.   Spent 45 minutes in the care of this patient.  Meds ordered this encounter  Medications   fluticasone (FLONASE) 50 MCG/ACT nasal spray    Sig: Place 2 sprays into both nostrils daily.    Dispense:  16 g    Refill:  0   albuterol  (VENTOLIN  HFA) 108 (90 Base) MCG/ACT inhaler    Sig: Inhale 1-2 puffs into the lungs every 4 (four) hours as needed for wheezing or shortness of breath.    Dispense:  1 each    Refill:  0   predniSONE  (DELTASONE ) 20 MG tablet    Sig: Take 2 tablets (40 mg total) by mouth  daily with breakfast for 5 days.    Dispense:  10 tablet    Refill:  0   Spacer/Aero-Holding Chambers (AEROCHAMBER MV) inhaler    Sig: Use as instructed    Dispense:  1 each    Refill:  1      *This clinic note was created using Dragon dictation software. Therefore, there may be occasional mistakes despite careful proofreading. ?    Ethlyn Herd, MD 05/09/23 [phone]

## 2023-05-09 ENCOUNTER — Encounter: Payer: Self-pay | Admitting: Emergency Medicine

## 2023-07-26 DIAGNOSIS — J479 Bronchiectasis, uncomplicated: Secondary | ICD-10-CM | POA: Diagnosis not present

## 2023-07-31 DIAGNOSIS — R918 Other nonspecific abnormal finding of lung field: Secondary | ICD-10-CM | POA: Diagnosis not present

## 2023-07-31 DIAGNOSIS — J479 Bronchiectasis, uncomplicated: Secondary | ICD-10-CM | POA: Diagnosis not present

## 2023-08-17 DIAGNOSIS — Z1389 Encounter for screening for other disorder: Secondary | ICD-10-CM | POA: Diagnosis not present

## 2023-08-17 DIAGNOSIS — J479 Bronchiectasis, uncomplicated: Secondary | ICD-10-CM | POA: Diagnosis not present

## 2023-08-17 DIAGNOSIS — L309 Dermatitis, unspecified: Secondary | ICD-10-CM | POA: Diagnosis not present

## 2023-10-23 DIAGNOSIS — G4733 Obstructive sleep apnea (adult) (pediatric): Secondary | ICD-10-CM | POA: Diagnosis not present

## 2023-10-23 DIAGNOSIS — J479 Bronchiectasis, uncomplicated: Secondary | ICD-10-CM | POA: Diagnosis not present

## 2023-11-21 DIAGNOSIS — J069 Acute upper respiratory infection, unspecified: Secondary | ICD-10-CM | POA: Diagnosis not present

## 2023-11-21 DIAGNOSIS — Z112 Encounter for screening for other bacterial diseases: Secondary | ICD-10-CM | POA: Diagnosis not present

## 2023-11-21 DIAGNOSIS — Z1159 Encounter for screening for other viral diseases: Secondary | ICD-10-CM | POA: Diagnosis not present

## 2023-11-21 DIAGNOSIS — Z20822 Contact with and (suspected) exposure to covid-19: Secondary | ICD-10-CM | POA: Diagnosis not present

## 2023-11-21 DIAGNOSIS — J4530 Mild persistent asthma, uncomplicated: Secondary | ICD-10-CM | POA: Diagnosis not present
